# Patient Record
Sex: Female | Born: 1965 | Race: Black or African American | Hispanic: No | Marital: Single | Smoking: Never smoker
Health system: Southern US, Community
[De-identification: ages and names within clinical notes are randomized; demographics above are authoritative.]

## PROBLEM LIST (undated history)

## (undated) DIAGNOSIS — I10 Essential (primary) hypertension: Secondary | ICD-10-CM

## (undated) DIAGNOSIS — R519 Headache, unspecified: Secondary | ICD-10-CM

## (undated) DIAGNOSIS — D649 Anemia, unspecified: Secondary | ICD-10-CM

## (undated) DIAGNOSIS — M1990 Unspecified osteoarthritis, unspecified site: Secondary | ICD-10-CM

## (undated) DIAGNOSIS — R51 Headache: Secondary | ICD-10-CM

## (undated) DIAGNOSIS — Z9889 Other specified postprocedural states: Secondary | ICD-10-CM

## (undated) HISTORY — DX: Headache: R51

## (undated) HISTORY — DX: Other specified postprocedural states: Z98.890

## (undated) HISTORY — PX: KNEE SURGERY: SHX244

## (undated) HISTORY — PX: FINGER SURGERY: SHX640

## (undated) HISTORY — PX: BREAST BIOPSY: SHX20

## (undated) HISTORY — PX: BREAST SURGERY: SHX581

## (undated) HISTORY — DX: Essential (primary) hypertension: I10

## (undated) HISTORY — DX: Headache, unspecified: R51.9

---

## 2005-01-04 ENCOUNTER — Ambulatory Visit: Payer: Self-pay | Admitting: Specialist

## 2005-05-04 ENCOUNTER — Ambulatory Visit: Payer: Self-pay | Admitting: Unknown Physician Specialty

## 2006-05-02 ENCOUNTER — Emergency Department: Payer: Self-pay | Admitting: General Practice

## 2010-06-28 ENCOUNTER — Ambulatory Visit: Payer: Self-pay | Admitting: Obstetrics and Gynecology

## 2014-04-12 HISTORY — PX: INTRAUTERINE DEVICE (IUD) INSERTION: SHX5877

## 2015-06-16 ENCOUNTER — Ambulatory Visit (INDEPENDENT_AMBULATORY_CARE_PROVIDER_SITE_OTHER): Payer: 59 | Admitting: Family

## 2015-06-16 ENCOUNTER — Encounter: Payer: Self-pay | Admitting: Family

## 2015-06-16 VITALS — BP 120/88 | HR 81 | Temp 98.6°F | Resp 18 | Ht 63.0 in | Wt 183.8 lb

## 2015-06-16 DIAGNOSIS — I10 Essential (primary) hypertension: Secondary | ICD-10-CM

## 2015-06-16 NOTE — Progress Notes (Signed)
Pre visit review using our clinic review tool, if applicable. No additional management support is needed unless otherwise documented below in the visit note. 

## 2015-06-16 NOTE — Assessment & Plan Note (Signed)
Recently diagnosed with hypertension and blood pressure remains just below goal of 140/90 with current regimen. Continue current dosage of lisinopril-hydrochlorothiazide. Monitor blood pressure at home. Follow-up with ophthalmology for eye exam. Follow up with office visit in 1 month.

## 2015-06-16 NOTE — Progress Notes (Signed)
Subjective:    Patient ID: Amanda Price, female    DOB: 22-Apr-1966, 49 y.o.   MRN: [id number]  Chief Complaint  Patient presents with  . Establish Care    has been having problems with her knees, says its been going on for years, when walking alot is when they bother her most    HPI:  Amanda Price is a 49 y.o. female with a PMH of hypertension and breast biopsy who presents today for an office visit to establish care.   1.) Blood pressure - Previously diagnosed with hypertension and was not maintained on any blood pressure medication. She was seen by her GYN and noted to have a blood pressure of 220 systolic and does not recall a diastolic medication. She was started on lisinopril-hydrochlorothiazide and takes the medication daily and denies cough or adverse side effect. She takes her blood pressures at the local pharmacy and had a reading of 118/86. Was last seen for an eye exam about year.    BP Readings from Last 3 Encounters:  06/16/15 120/88    No Known Allergies   No outpatient prescriptions prior to visit.   No facility-administered medications prior to visit.     Past Medical History  Diagnosis Date  . Hypertension   . H/O breast biopsy      Past Surgical History  Procedure Laterality Date  . Breast surgery      Breast biopsy  . Knee surgery Right      Family History  Problem Relation Age of Onset  . Healthy Mother   . Healthy Father   . Hypertension Sister   . Hypertension Sister   . Hypertension Sister      History   Social History  . Marital Status: Single    Spouse Name: N/A  . Number of Children: 2  . Years of Education: 14   Occupational History  . ATM Technician   . Freight forwarder    Social History Main Topics  . Smoking status: Never Smoker   . Smokeless tobacco: Never Used  . Alcohol Use: 0.0 oz/week    0 Standard drinks or equivalent per week     Comment: Occasionally  . Drug Use: No  . Sexual Activity: Not on file    Other Topics Concern  . Not on file   Social History Narrative   Fun: Read   Denies religious beliefs effecting health care.   Denies abuse and feels safe at home.      Review of Systems  Eyes:       Negative for changes in vision.   Respiratory: Negative for chest tightness.   Cardiovascular: Negative for chest pain, palpitations and leg swelling.      Objective:    BP 120/88 mmHg  Pulse 81  Temp(Src) 98.6 F (37 C) (Oral)  Resp 18  Ht 5' 3" (1.6 m)  Wt 183 lb 12.8 oz (83.371 kg)  BMI 32.57 kg/m2  SpO2 98% Nursing note and vital signs reviewed.  Physical Exam  Constitutional: She is oriented to person, place, and time. She appears well-developed and well-nourished. No distress.  Cardiovascular: Normal rate, regular rhythm, normal heart sounds and intact distal pulses.   Pulmonary/Chest: Effort normal and breath sounds normal.  Neurological: She is alert and oriented to person, place, and time.  Skin: Skin is warm and dry.  Psychiatric: She has a normal mood and affect. Her behavior is normal. Judgment and thought content normal.  Assessment & Plan:   Problem List Items Addressed This Visit      Cardiovascular and Mediastinum   Essential hypertension - Primary    Recently diagnosed with hypertension and blood pressure remains just below goal of 140/90 with current regimen. Continue current dosage of lisinopril-hydrochlorothiazide. Monitor blood pressure at home. Follow-up with ophthalmology for eye exam. Follow up with office visit in 1 month.      Relevant Medications   lisinopril-hydrochlorothiazide (PRINZIDE,ZESTORETIC) 10-12.5 MG per tablet

## 2015-06-16 NOTE — Patient Instructions (Signed)
Thank you for choosing [facility].  Summary/Instructions:  Your prescription(s) have been submitted to your pharmacy or been printed and provided for you. Please take as directed and contact our office if you believe you are having problem(s) with the medication(s) or have any questions.  If your symptoms worsen or fail to improve, please contact our office for further instruction, or in case of emergency go directly to the emergency room at the closest medical facility.   Hypertension Hypertension, commonly called high blood pressure, is when the force of blood pumping through your arteries is too strong. Your arteries are the blood vessels that carry blood from your heart throughout your body. A blood pressure reading consists of a higher number over a lower number, such as 110/72. The higher number (systolic) is the pressure inside your arteries when your heart pumps. The lower number (diastolic) is the pressure inside your arteries when your heart relaxes. Ideally you want your blood pressure below 120/80. Hypertension forces your heart to work harder to pump blood. Your arteries may become narrow or stiff. Having hypertension puts you at risk for heart disease, stroke, and other problems.  RISK FACTORS Some risk factors for high blood pressure are controllable. Others are not.  Risk factors you cannot control include:   Race. You may be at higher risk if you are African American.  Age. Risk increases with age.  Gender. Men are at higher risk than women before age 45 years. After age 65, women are at higher risk than men. Risk factors you can control include:  Not getting enough exercise or physical activity.  Being overweight.  Getting too much fat, sugar, calories, or salt in your diet.  Drinking too much alcohol. SIGNS AND SYMPTOMS Hypertension does not usually cause signs or symptoms. Extremely high blood pressure (hypertensive crisis) may cause headache, anxiety,  shortness of breath, and nosebleed. DIAGNOSIS  To check if you have hypertension, your health care provider will measure your blood pressure while you are seated, with your arm held at the level of your heart. It should be measured at least twice using the same arm. Certain conditions can cause a difference in blood pressure between your right and left arms. A blood pressure reading that is higher than normal on one occasion does not mean that you need treatment. If one blood pressure reading is high, ask your health care provider about having it checked again. TREATMENT  Treating high blood pressure includes making lifestyle changes and possibly taking medicine. Living a healthy lifestyle can help lower high blood pressure. You may need to change some of your habits. Lifestyle changes may include:  Following the DASH diet. This diet is high in fruits, vegetables, and whole grains. It is low in salt, red meat, and added sugars.  Getting at least 2 hours of brisk physical activity every week.  Losing weight if necessary.  Not smoking.  Limiting alcoholic beverages.  Learning ways to reduce stress. If lifestyle changes are not enough to get your blood pressure under control, your health care provider may prescribe medicine. You may need to take more than one. Work closely with your health care provider to understand the risks and benefits. HOME CARE INSTRUCTIONS  Have your blood pressure rechecked as directed by your health care provider.   Take medicines only as directed by your health care provider. Follow the directions carefully. Blood pressure medicines must be taken as prescribed. The medicine does not work as well when you skip doses.   Skipping doses also puts you at risk for problems.   Do not smoke.   Monitor your blood pressure at home as directed by your health care provider. SEEK MEDICAL CARE IF:   You think you are having a reaction to medicines taken.  You have  recurrent headaches or feel dizzy.  You have swelling in your ankles.  You have trouble with your vision. SEEK IMMEDIATE MEDICAL CARE IF:  You develop a severe headache or confusion.  You have unusual weakness, numbness, or feel faint.  You have severe chest or abdominal pain.  You vomit repeatedly.  You have trouble breathing. MAKE SURE YOU:   Understand these instructions.  Will watch your condition.  Will get help right away if you are not doing well or get worse. Document Released: 10/29/2005 Document Revised: 03/15/2014 Document Reviewed: 08/21/2013 ExitCare Patient Information 2015 [employer]. This information is not intended to replace advice given to you by your health care provider. Make sure you discuss any questions you have with your health care provider.   

## 2015-07-14 ENCOUNTER — Telehealth: Payer: Self-pay | Admitting: Family

## 2015-07-14 MED ORDER — LISINOPRIL-HYDROCHLOROTHIAZIDE 10-12.5 MG PO TABS: 1.0000 | ORAL_TABLET | Freq: Every day | ORAL | Status: DC

## 2015-07-14 NOTE — Telephone Encounter (Signed)
Patient requesting refill for lisinopril-hydrochlorothiazide (PRINZIDE,ZESTORETIC) 10-12.5 MG per tablet [[id number]]  Pharmacy is [facility] in [place], [place]

## 2015-07-14 NOTE — Telephone Encounter (Signed)
Rx sent. Pt aware.  

## 2015-08-16 LAB — HM COLONOSCOPY

## 2015-08-16 LAB — HM PAP SMEAR: HM Pap smear: NORMAL

## 2015-09-26 ENCOUNTER — Ambulatory Visit: Payer: 59 | Admitting: Anesthesiology

## 2015-09-26 ENCOUNTER — Encounter
Admission: RE | Disposition: A | Discharge status: Home / Self Care | Payer: Self-pay | Source: Ambulatory Visit | Attending: Gastroenterology

## 2015-09-26 ENCOUNTER — Encounter: Payer: Self-pay | Admitting: *Deleted

## 2015-09-26 ENCOUNTER — Ambulatory Visit
Admission: RE | Admit: 2015-09-26 | Discharge: 2015-09-26 | Disposition: A | Discharge status: Home / Self Care | Payer: 59 | Source: Ambulatory Visit | Attending: Gastroenterology | Admitting: Gastroenterology

## 2015-09-26 DIAGNOSIS — I10 Essential (primary) hypertension: Secondary | ICD-10-CM | POA: Insufficient documentation

## 2015-09-26 DIAGNOSIS — Z1211 Encounter for screening for malignant neoplasm of colon: Secondary | ICD-10-CM | POA: Insufficient documentation

## 2015-09-26 DIAGNOSIS — Z79899 Other long term (current) drug therapy: Secondary | ICD-10-CM | POA: Diagnosis not present

## 2015-09-26 HISTORY — PX: COLONOSCOPY: SHX5424

## 2015-09-26 LAB — POCT PREGNANCY, URINE: Preg Test, Ur: NEGATIVE

## 2015-09-26 SURGERY — COLONOSCOPY
Anesthesia: General

## 2015-09-26 MED ORDER — PROPOFOL 10 MG/ML IV BOLUS
INTRAVENOUS | Status: DC | PRN
  Administered 2015-09-26 (×2): 50 mg via INTRAVENOUS

## 2015-09-26 MED ORDER — LIDOCAINE HCL (PF) 1 % IJ SOLN
2.0000 mL | Freq: Once | INTRAMUSCULAR | Status: AC
  Administered 2015-09-26: 2 mL via INTRADERMAL
  Filled 2015-09-26: qty 2

## 2015-09-26 MED ORDER — MIDAZOLAM HCL 5 MG/5ML IJ SOLN
INTRAMUSCULAR | Status: DC | PRN
  Administered 2015-09-26: 1 mg via INTRAVENOUS

## 2015-09-26 MED ORDER — PHENYLEPHRINE HCL 10 MG/ML IJ SOLN
INTRAMUSCULAR | Status: DC | PRN
  Administered 2015-09-26: .1 ug via INTRAVENOUS

## 2015-09-26 MED ORDER — PROPOFOL 500 MG/50ML IV EMUL
INTRAVENOUS | Status: DC | PRN
  Administered 2015-09-26: 140 ug/kg/min via INTRAVENOUS

## 2015-09-26 MED ORDER — FENTANYL CITRATE (PF) 100 MCG/2ML IJ SOLN
INTRAMUSCULAR | Status: DC | PRN
  Administered 2015-09-26: 50 ug via INTRAVENOUS

## 2015-09-26 MED ORDER — SODIUM CHLORIDE 0.9 % IV SOLN
INTRAVENOUS | Status: DC
Start: 2015-09-26 — End: 2015-09-26
  Administered 2015-09-26: 1000 mL via INTRAVENOUS
  Administered 2015-09-26: 12:00:00 via INTRAVENOUS

## 2015-09-26 MED ORDER — LIDOCAINE HCL (CARDIAC) 20 MG/ML IV SOLN
INTRAVENOUS | Status: DC | PRN
  Administered 2015-09-26: 30 mg via INTRAVENOUS

## 2015-09-26 NOTE — Anesthesia Postprocedure Evaluation (Signed)
  Anesthesia Post-op Note  Patient: Amanda Price  Procedure(s) Performed: Procedure(s): COLONOSCOPY (N/A)  Anesthesia type:General  Patient location: PACU  Post pain: Pain level controlled  Post assessment: Post-op Vital signs reviewed, Patient's Cardiovascular Status Stable, Respiratory Function Stable, Patent Airway and No signs of Nausea or vomiting  Post vital signs: Reviewed and stable  Last Vitals:  Filed Vitals:   09/26/15 1310  BP: 107/72  Pulse: 85  Temp:   Resp: 15    Level of consciousness: awake, alert  and patient cooperative  Complications: No apparent anesthesia complications

## 2015-09-26 NOTE — Anesthesia Preprocedure Evaluation (Signed)
Anesthesia Evaluation  Patient identified by MRN, date of birth, ID band Patient awake    Reviewed: Allergy & Precautions, H&P , NPO status , Patient's Chart, lab work & pertinent test results, reviewed documented beta blocker date and time   Airway Mallampati: II  TM Distance: >3 FB Neck ROM: full    Dental no notable dental hx.    Pulmonary neg pulmonary ROS,    Pulmonary exam normal breath sounds clear to auscultation       Cardiovascular Exercise Tolerance: Good hypertension, negative cardio ROS   Rhythm:regular Rate:Normal     Neuro/Psych negative neurological ROS  negative psych ROS   GI/Hepatic negative GI ROS, Neg liver ROS,   Endo/Other  negative endocrine ROS  Renal/GU negative Renal ROS  negative genitourinary   Musculoskeletal   Abdominal   Peds  Hematology negative hematology ROS (+)   Anesthesia Other Findings   Reproductive/Obstetrics negative OB ROS                             Anesthesia Physical Anesthesia Plan  ASA: II  Anesthesia Plan: General   Post-op Pain Management:    Induction:   Airway Management Planned:   Additional Equipment:   Intra-op Plan:   Post-operative Plan:   Informed Consent: I have reviewed the patients History and Physical, chart, labs and discussed the procedure including the risks, benefits and alternatives for the proposed anesthesia with the patient or authorized representative who has indicated his/her understanding and acceptance.   Dental Advisory Given  Plan Discussed with: CRNA  Anesthesia Plan Comments:         Anesthesia Quick Evaluation  

## 2015-09-26 NOTE — H&P (Signed)
Outpatient short stay form Pre-procedure 09/26/2015 11:54 AM Christena DeemMartin U Lovena Kluck MD  [facility]  Reason for visit:  Screening colonoscopy  History of present illness:  Patient is a 49 year old female presenting today for screening colonoscopy. Tolerated her prep well. She takes no aspirin or blood thinning products. Is is her first colonoscopy.    Current facility-administered medications:  .  0.9 %  sodium chloride infusion, , Intravenous, Continuous, Christena DeemMartin U Charles Niese, MD, Last Rate: 20 mL/hr at 09/26/15 1124  Prescriptions prior to admission  Medication Sig Dispense Refill Last Dose  . levonorgestrel (MIRENA) 20 MCG/24HR IUD 1 each by Intrauterine route once.     Marland Kitchen. lisinopril-hydrochlorothiazide (PRINZIDE,ZESTORETIC) 10-12.5 MG per tablet Take 1 tablet by mouth daily. 30 tablet 2 09/26/2015 at 0600     No Known Allergies   Past Medical History  Diagnosis Date  . Hypertension   . H/O breast biopsy     Review of systems:      Physical Exam    Heart and lungs: Regular rate and rhythm without rub or gallop, lungs are bilaterally clear.    HEENT: Normocephalic atraumatic eyes are anicteric    Other:     Pertinant exam for procedure: Soft nontender nondistended bowel sounds positive and normoactive.    Planned proceedures: Colonoscopy and indicated procedures. I have discussed the risks benefits and complications of procedures to include not limited to bleeding, infection, perforation and the risk of sedation and the patient wishes to proceed.    Christena DeemMartin U Terease Marcotte, MD Gastroenterology 09/26/2015  11:54 AM

## 2015-09-26 NOTE — Op Note (Signed)
[facility] Patient Name: Amanda Price Procedure Date: 09/26/2015 12:02 PM MRN: [id number] Account #: [id number] Date of Birth: [birth year] Admit Type: Outpatient Age: 49 Room: [facility] ENDO ROOM 3 Gender: Female Note Status: Finalized Procedure:         Colonoscopy Indications:       Screening for colorectal malignant neoplasm Providers:         Christena Deem, MD Referring MD:      Tama Headings. Calone (Referring MD) Medicines:         Monitored Anesthesia Care Complications:     No immediate complications. Procedure:         Pre-Anesthesia Assessment:                    - ASA Grade Assessment: II - A patient with mild systemic                     disease.                    After obtaining informed consent, the colonoscope was                     passed under direct vision. Throughout the procedure, the                     patient's blood pressure, pulse, and oxygen saturations                     were monitored continuously. The Colonoscope was                     introduced through the anus and advanced to the the cecum,                     identified by appendiceal orifice and ileocecal valve. The                     colonoscopy was unusually difficult due to a tortuous                     colon. Successful completion of the procedure was aided by                     changing the patient to a supine position and using manual                     pressure. The patient tolerated the procedure well. The                     quality of the bowel preparation was good. Findings:      The entire examined colon appeared normal.      The digital rectal exam was normal.      The sigmoid colon and descending colon were moderately redundant. Impression:        - The entire examined colon is normal.                    - No specimens collected. Recommendation:    - Discharge patient to home.                    - Repeat colonoscopy in 10 years for screening  purposes. Procedure Code(s): --- Professional ---  [id number], Colonoscopy, flexible; diagnostic, including                     collection of specimen(s) by brushing or washing, when                     performed (separate procedure) Diagnosis Code(s): --- Professional ---                    Z12.11, Encounter for screening for malignant neoplasm of                     colon CPT copyright 2014 American Medical Association. All rights reserved. The codes documented in this report are preliminary and upon coder review may  be revised to meet current compliance requirements. Christena DeemMartin U Skulskie, MD 09/26/2015 12:38:31 PM This report has been signed electronically. Number of Addenda: 0 Note Initiated On: 09/26/2015 12:02 PM Scope Withdrawal Time: 0 hours 6 minutes 34 seconds  Total Procedure Duration: 0 hours 23 minutes 25 seconds       [facility]

## 2015-09-26 NOTE — Transfer of Care (Signed)
Immediate Anesthesia Transfer of Care Note  Patient: Amanda Price  Procedure(s) Performed: Procedure(s): COLONOSCOPY (N/A)  Patient Location: PACU and Short Stay  Anesthesia Type:General  Level of Consciousness: awake, sedated and patient cooperative  Airway & Oxygen Therapy: Patient Spontanous Breathing and Patient connected to nasal cannula oxygen  Post-op Assessment: Report given to RN and Post -op Vital signs reviewed and stable  Post vital signs: Reviewed and stable  Last Vitals:  Filed Vitals:   09/26/15 1239  BP: 90/59  Pulse: 90  Temp: 35.9 C  Resp:     Complications: No apparent anesthesia complications

## 2015-09-28 ENCOUNTER — Encounter: Payer: Self-pay | Admitting: Gastroenterology

## 2015-12-01 ENCOUNTER — Other Ambulatory Visit: Payer: Self-pay | Admitting: Orthopedic Surgery

## 2015-12-01 DIAGNOSIS — M1711 Unilateral primary osteoarthritis, right knee: Secondary | ICD-10-CM

## 2015-12-08 ENCOUNTER — Ambulatory Visit: Payer: 59

## 2015-12-16 ENCOUNTER — Ambulatory Visit: Payer: 59

## 2015-12-30 ENCOUNTER — Ambulatory Visit: Payer: 59 | Attending: Orthopedic Surgery

## 2016-02-03 ENCOUNTER — Other Ambulatory Visit: Payer: Self-pay

## 2016-02-03 MED ORDER — LISINOPRIL-HYDROCHLOROTHIAZIDE 10-12.5 MG PO TABS: 1.0000 | ORAL_TABLET | Freq: Every day | ORAL | Status: DC

## 2016-07-02 ENCOUNTER — Other Ambulatory Visit: Payer: Self-pay | Admitting: Family

## 2016-08-15 ENCOUNTER — Other Ambulatory Visit: Payer: Self-pay | Admitting: Obstetrics and Gynecology

## 2016-08-15 ENCOUNTER — Other Ambulatory Visit: Payer: Self-pay | Admitting: Advanced Practice Midwife

## 2016-08-15 DIAGNOSIS — Z1231 Encounter for screening mammogram for malignant neoplasm of breast: Secondary | ICD-10-CM

## 2016-09-11 ENCOUNTER — Ambulatory Visit
Admission: RE | Admit: 2016-09-11 | Discharge: 2016-09-11 | Disposition: A | Discharge status: Home / Self Care | Payer: 59 | Source: Ambulatory Visit | Attending: Advanced Practice Midwife | Admitting: Advanced Practice Midwife

## 2016-09-11 ENCOUNTER — Encounter: Payer: Self-pay | Admitting: Radiology

## 2016-09-11 DIAGNOSIS — Z1231 Encounter for screening mammogram for malignant neoplasm of breast: Secondary | ICD-10-CM | POA: Diagnosis not present

## 2016-09-14 ENCOUNTER — Other Ambulatory Visit: Payer: Self-pay | Admitting: Advanced Practice Midwife

## 2016-09-14 DIAGNOSIS — R928 Other abnormal and inconclusive findings on diagnostic imaging of breast: Secondary | ICD-10-CM

## 2016-09-17 ENCOUNTER — Ambulatory Visit (INDEPENDENT_AMBULATORY_CARE_PROVIDER_SITE_OTHER): Payer: 59 | Admitting: Family

## 2016-09-17 ENCOUNTER — Encounter: Payer: Self-pay | Admitting: Family

## 2016-09-17 VITALS — BP 140/90 | HR 98 | Temp 98.0°F | Ht 63.0 in | Wt 189.6 lb

## 2016-09-17 DIAGNOSIS — I10 Essential (primary) hypertension: Secondary | ICD-10-CM

## 2016-09-17 DIAGNOSIS — Z7689 Persons encountering health services in other specified circumstances: Secondary | ICD-10-CM | POA: Diagnosis not present

## 2016-09-17 MED ORDER — LISINOPRIL-HYDROCHLOROTHIAZIDE 10-12.5 MG PO TABS: 1.0000 | ORAL_TABLET | Freq: Every day | ORAL | 0 refills | Status: DC

## 2016-09-17 NOTE — Patient Instructions (Signed)
Follow up in one week.  Need labs from [facility].   If there is no improvement in your symptoms, or if there is any worsening of symptoms, or if you have any additional concerns, please return for re-evaluation; or, if we are closed, consider going to the Emergency Room for evaluation if symptoms urgent.  Managing Your High Blood Pressure Blood pressure is a measurement of how forceful your blood is pressing against the walls of the arteries. Arteries are muscular tubes within the circulatory system. Blood pressure does not stay the same. Blood pressure rises when you are active, excited, or nervous; and it lowers during sleep and relaxation. If the numbers measuring your blood pressure stay above normal most of the time, you are at risk for health problems. High blood pressure (hypertension) is a long-term (chronic) condition in which blood pressure is elevated. A blood pressure reading is recorded as two numbers, such as 120 over 80 (or 120/80). The first, higher number is called the systolic pressure. It is a measure of the pressure in your arteries as the heart beats. The second, lower number is called the diastolic pressure. It is a measure of the pressure in your arteries as the heart relaxes between beats.  Keeping your blood pressure in a normal range is important to your overall health and prevention of health problems, such as heart disease and stroke. When your blood pressure is uncontrolled, your heart has to work harder than normal. High blood pressure is a very common condition in adults because blood pressure tends to rise with age. Men and women are equally likely to have hypertension but at different times in life. Before age 51, men are more likely to have hypertension. After 51 years of age, women are more likely to have it. Hypertension is especially common in African Americans. This condition often has no signs or symptoms. The cause of the condition is usually not known. Your caregiver  can help you come up with a plan to keep your blood pressure in a normal, healthy range. BLOOD PRESSURE STAGES Blood pressure is classified into four stages: normal, prehypertension, stage 1, and stage 2. Your blood pressure reading will be used to determine what type of treatment, if any, is necessary. Appropriate treatment options are tied to these four stages:  Normal  Systolic pressure (mm Hg): below 120.  Diastolic pressure (mm Hg): below 80. Prehypertension  Systolic pressure (mm Hg): 120 to 139.  Diastolic pressure (mm Hg): 80 to 89. Stage1  Systolic pressure (mm Hg): 140 to 159.  Diastolic pressure (mm Hg): 90 to 99. Stage2  Systolic pressure (mm Hg): 160 or above.  Diastolic pressure (mm Hg): 100 or above. RISKS RELATED TO HIGH BLOOD PRESSURE Managing your blood pressure is an important responsibility. Uncontrolled high blood pressure can lead to:  A heart attack.  A stroke.  A weakened blood vessel (aneurysm).  Heart failure.  Kidney damage.  Eye damage.  Metabolic syndrome.  Memory and concentration problems. HOW TO MANAGE YOUR BLOOD PRESSURE Blood pressure can be managed effectively with lifestyle changes and medicines (if needed). Your caregiver will help you come up with a plan to bring your blood pressure within a normal range. Your plan should include the following: Education  Read all information provided by your caregivers about how to control blood pressure.  Educate yourself on the latest guidelines and treatment recommendations. New research is always being done to further define the risks and treatments for high blood pressure. Lifestylechanges  Control your weight.  Avoid smoking.  Stay physically active.  Reduce the amount of salt in your diet.  Reduce stress.  Control any chronic conditions, such as high cholesterol or diabetes.  Reduce your alcohol intake. Medicines  Several medicines (antihypertensive medicines) are  available, if needed, to bring blood pressure within a normal range. Communication  Review all the medicines you take with your caregiver because there may be side effects or interactions.  Talk with your caregiver about your diet, exercise habits, and other lifestyle factors that may be contributing to high blood pressure.  See your caregiver regularly. Your caregiver can help you create and adjust your plan for managing high blood pressure. RECOMMENDATIONS FOR TREATMENT AND FOLLOW-UP  The following recommendations are based on current guidelines for managing high blood pressure in nonpregnant adults. Use these recommendations to identify the proper follow-up period or treatment option based on your blood pressure reading. You can discuss these options with your caregiver.  Systolic pressure of 120 to 139 or diastolic pressure of 80 to 89: Follow up with your caregiver as directed.  Systolic pressure of 140 to 160 or diastolic pressure of 90 to 100: Follow up with your caregiver within 2 months.  Systolic pressure above 160 or diastolic pressure above 100: Follow up with your caregiver within 1 month.  Systolic pressure above 180 or diastolic pressure above 110: Consider antihypertensive therapy; follow up with your caregiver within 1 week.  Systolic pressure above 200 or diastolic pressure above 120: Begin antihypertensive therapy; follow up with your caregiver within 1 week.   This information is not intended to replace advice given to you by your health care provider. Make sure you discuss any questions you have with your health care provider.   Document Released: 07/23/2012 Document Reviewed: 07/23/2012 Elsevier Interactive Patient Education [employer]2016 [employer] Inc.

## 2016-09-17 NOTE — Assessment & Plan Note (Addendum)
Uncontrolled. Reassured by normal neurologic exam.No signs or symptoms of hypertensive urgency or emergency at this time. Education provided regarding controlling blood pressure. Gave patient 0.1 mg clonidine and BP came down. Patient not dizzy. Advised that she may need to skip dose of Prinzide today after clonidine. Patient verbalized understanding. Advised to get BP cuff. F/u one week.

## 2016-09-17 NOTE — Progress Notes (Signed)
Pre visit review using our clinic review tool, if applicable. No additional management support is needed unless otherwise documented below in the visit note. 

## 2016-09-17 NOTE — Progress Notes (Signed)
Subjective:    Patient ID: Amanda Price, female    DOB: 01/31/1966, 50 y.o.   MRN: [id number]  CC: Amanda SilvasChante Price is a 50 y.o. female who presents today to establish care.    HPI: Patient is here to establish care as new patient. She was seen by Marcos EkeGreg Calone in 2016 periodically when BP 'high.'. Prior care had been with [facility], requesting records.  She plans to to continue keep women care with [facility]. Had abnormal breast mammogram last week and scheduled repeat.   HTN- has run out of medication. Mild HA today. Denies exertional chest pain or pressure, numbness or tingling radiating to left arm or jaw, palpitations, dizziness,changes in vision, or shortness of breath. Had eye exam in past year. No BP cuff at home. Takes BP at pharmacy.            HISTORY:  Past Medical History:  Diagnosis Date  . Frequent headaches   . H/O breast biopsy   . Hypertension    Past Surgical History:  Procedure Laterality Date  . BREAST BIOPSY    . BREAST SURGERY     Breast biopsy  . COLONOSCOPY N/A 09/26/2015   Procedure: COLONOSCOPY;  Surgeon: Christena DeemMartin U Skulskie, MD;  [facility];  Service: Endoscopy;  Laterality: N/A;  . KNEE SURGERY Right    Family History  Problem Relation Age of Onset  . Healthy Mother   . Healthy Father   . Hypertension Sister   . Diabetes Sister   . Hypertension Sister   . Diabetes Sister   . Hypertension Sister   . Diabetes Sister     Allergies: Patient has no known allergies. Current Outpatient Prescriptions on File Prior to Visit  Medication Sig Dispense Refill  . levonorgestrel (MIRENA) 20 MCG/24HR IUD 1 each by Intrauterine route once.     No current facility-administered medications on file prior to visit.     Social History  Substance Use Topics  . Smoking status: Never Smoker  . Smokeless tobacco: Never Used  . Alcohol use 0.0 oz/week     Comment: Occasionally    Review of Systems  Constitutional: Negative for chills and  fever.  Eyes: Negative for visual disturbance.  Respiratory: Negative for cough.   Cardiovascular: Negative for chest pain and palpitations.  Gastrointestinal: Negative for nausea and vomiting.  Neurological: Positive for headaches (mild). Negative for dizziness.      Objective:    BP 140/90   Pulse 98   Temp 98 F (36.7 C) (Oral)   Ht 5\' 3"  (1.6 m)   Wt 189 lb 9.6 oz (86 kg)   SpO2 99%   BMI 33.59 kg/m  BP Readings from Last 3 Encounters:  09/17/16 140/90  09/26/15 107/72  06/16/15 120/88   Wt Readings from Last 3 Encounters:  09/17/16 189 lb 9.6 oz (86 kg)  09/26/15 183 lb (83 kg)  06/16/15 183 lb 12.8 oz (83.4 kg)    Physical Exam  Constitutional: She appears well-developed and well-nourished.  HENT:  Mouth/Throat: Uvula is midline, oropharynx is clear and moist and mucous membranes are normal.  Eyes: Conjunctivae and EOM are normal. Pupils are equal, round, and reactive to light.  Fundus normal bilaterally.   Cardiovascular: Normal rate, regular rhythm, normal heart sounds and normal pulses.   Pulmonary/Chest: Effort normal and breath sounds normal. She has no wheezes. She has no rhonchi. She has no rales.  Neurological: She is alert. She has normal strength. No cranial  nerve deficit or sensory deficit. She displays a negative Romberg sign.  Reflex Scores:      Bicep reflexes are 2+ on the right side and 2+ on the left side.      Patellar reflexes are 2+ on the right side and 2+ on the left side. Grip equal and strong bilateral upper extremities. Gait strong and steady. Able to perform rapid alternating movement without difficulty.   Skin: Skin is warm and dry.  Psychiatric: She has a normal mood and affect. Her speech is normal and behavior is normal. Thought content normal.  Vitals reviewed.      Assessment & Plan:   Problem List Items Addressed This Visit      Cardiovascular and Mediastinum   Essential hypertension - Primary    Uncontrolled. Reassured by  normal neurologic exam.No signs or symptoms of hypertensive urgency or emergency at this time. Education provided regarding controlling blood pressure. Gave patient 0.1 mg clonidine and BP came down. Patient not dizzy. Advised that she may need to skip dose of Prinzide today after clonidine. Patient verbalized understanding. Advised to get BP cuff. F/u one week.       Relevant Medications   lisinopril-hydrochlorothiazide (PRINZIDE,ZESTORETIC) 10-12.5 MG tablet     Other   Encounter to establish care    Reviewed past medical social and family history with patient. We do not have recent labs and patient would prefer to get them [facility] versus drawing them again today. We are requesting those records. Patient will return for physical.           I am having Ms. Flanagin maintain her levonorgestrel and lisinopril-hydrochlorothiazide.   Meds ordered this encounter  Medications  . lisinopril-hydrochlorothiazide (PRINZIDE,ZESTORETIC) 10-12.5 MG tablet    Sig: Take 1 tablet by mouth daily.    Dispense:  90 tablet    Refill:  0    Needs OV for further refills    Order Specific Question:   Supervising Provider    Answer:   Sherlene ShamsULLO, TERESA L [[id number]]    Return precautions given.   Risks, benefits, and alternatives of the medications and treatment plan prescribed today were discussed, and patient expressed understanding.   Education regarding symptom management and diagnosis given to patient on AVS.  Continue to follow with Rennie PlowmanMargaret Murel Wigle, FNP for routine health maintenance.   Aveah Pascua and I agreed with plan.   Rennie PlowmanMargaret Reggie Bise, FNP

## 2016-09-17 NOTE — Assessment & Plan Note (Signed)
Reviewed past medical social and family history with patient. We do not have recent labs and patient would prefer to get them from [facility] versus drawing them again today. We are requesting those records. Patient will return for physical.

## 2016-09-24 ENCOUNTER — Encounter: Payer: Self-pay | Admitting: Family

## 2016-09-24 ENCOUNTER — Ambulatory Visit (INDEPENDENT_AMBULATORY_CARE_PROVIDER_SITE_OTHER): Payer: 59 | Admitting: Family

## 2016-09-24 VITALS — BP 140/70 | HR 87 | Temp 98.0°F | Ht 63.0 in | Wt 188.6 lb

## 2016-09-24 DIAGNOSIS — I10 Essential (primary) hypertension: Secondary | ICD-10-CM | POA: Diagnosis not present

## 2016-09-24 NOTE — Assessment & Plan Note (Addendum)
At goal. CMP in 2 weeks. F/u 6 months.

## 2016-09-24 NOTE — Patient Instructions (Signed)
You look great.  If there is no improvement in your symptoms, or if there is any worsening of symptoms, or if you have any additional concerns, please return for re-evaluation; or, if we are closed, consider going to the Emergency Room for evaluation if symptoms urgent.  Managing Your High Blood Pressure Blood pressure is a measurement of how forceful your blood is pressing against the walls of the arteries. Arteries are muscular tubes within the circulatory system. Blood pressure does not stay the same. Blood pressure rises when you are active, excited, or nervous; and it lowers during sleep and relaxation. If the numbers measuring your blood pressure stay above normal most of the time, you are at risk for health problems. High blood pressure (hypertension) is a long-term (chronic) condition in which blood pressure is elevated. A blood pressure reading is recorded as two numbers, such as 120 over 80 (or 120/80). The first, higher number is called the systolic pressure. It is a measure of the pressure in your arteries as the heart beats. The second, lower number is called the diastolic pressure. It is a measure of the pressure in your arteries as the heart relaxes between beats.  Keeping your blood pressure in a normal range is important to your overall health and prevention of health problems, such as heart disease and stroke. When your blood pressure is uncontrolled, your heart has to work harder than normal. High blood pressure is a very common condition in adults because blood pressure tends to rise with age. Men and women are equally likely to have hypertension but at different times in life. Before age 51, men are more likely to have hypertension. After 51 years of age, women are more likely to have it. Hypertension is especially common in African Americans. This condition often has no signs or symptoms. The cause of the condition is usually not known. Your caregiver can help you come up with a plan to  keep your blood pressure in a normal, healthy range. BLOOD PRESSURE STAGES Blood pressure is classified into four stages: normal, prehypertension, stage 1, and stage 2. Your blood pressure reading will be used to determine what type of treatment, if any, is necessary. Appropriate treatment options are tied to these four stages:  Normal  Systolic pressure (mm Hg): below 120.  Diastolic pressure (mm Hg): below 80. Prehypertension  Systolic pressure (mm Hg): 120 to 139.  Diastolic pressure (mm Hg): 80 to 89. Stage1  Systolic pressure (mm Hg): 140 to 159.  Diastolic pressure (mm Hg): 90 to 99. Stage2  Systolic pressure (mm Hg): 160 or above.  Diastolic pressure (mm Hg): 100 or above. RISKS RELATED TO HIGH BLOOD PRESSURE Managing your blood pressure is an important responsibility. Uncontrolled high blood pressure can lead to:  A heart attack.  A stroke.  A weakened blood vessel (aneurysm).  Heart failure.  Kidney damage.  Eye damage.  Metabolic syndrome.  Memory and concentration problems. HOW TO MANAGE YOUR BLOOD PRESSURE Blood pressure can be managed effectively with lifestyle changes and medicines (if needed). Your caregiver will help you come up with a plan to bring your blood pressure within a normal range. Your plan should include the following: Education  Read all information provided by your caregivers about how to control blood pressure.  Educate yourself on the latest guidelines and treatment recommendations. New research is always being done to further define the risks and treatments for high blood pressure. Lifestylechanges  Control your weight.  Avoid smoking.  Stay  physically active.  Reduce the amount of salt in your diet.  Reduce stress.  Control any chronic conditions, such as high cholesterol or diabetes.  Reduce your alcohol intake. Medicines  Several medicines (antihypertensive medicines) are available, if needed, to bring blood  pressure within a normal range. Communication  Review all the medicines you take with your caregiver because there may be side effects or interactions.  Talk with your caregiver about your diet, exercise habits, and other lifestyle factors that may be contributing to high blood pressure.  See your caregiver regularly. Your caregiver can help you create and adjust your plan for managing high blood pressure. RECOMMENDATIONS FOR TREATMENT AND FOLLOW-UP  The following recommendations are based on current guidelines for managing high blood pressure in nonpregnant adults. Use these recommendations to identify the proper follow-up period or treatment option based on your blood pressure reading. You can discuss these options with your caregiver.  Systolic pressure of 120 to 139 or diastolic pressure of 80 to 89: Follow up with your caregiver as directed.  Systolic pressure of 140 to 160 or diastolic pressure of 90 to 100: Follow up with your caregiver within 2 months.  Systolic pressure above 160 or diastolic pressure above 100: Follow up with your caregiver within 1 month.  Systolic pressure above 180 or diastolic pressure above 110: Consider antihypertensive therapy; follow up with your caregiver within 1 week.  Systolic pressure above 200 or diastolic pressure above 120: Begin antihypertensive therapy; follow up with your caregiver within 1 week.   This information is not intended to replace advice given to you by your health care provider. Make sure you discuss any questions you have with your health care provider.   Document Released: 07/23/2012 Document Reviewed: 07/23/2012 Elsevier Interactive Patient Education [employer]2016 Elsevier Inc.

## 2016-09-24 NOTE — Progress Notes (Signed)
Pre visit review using our clinic review tool, if applicable. No additional management support is needed unless otherwise documented below in the visit note. 

## 2016-09-24 NOTE — Progress Notes (Signed)
Subjective:    Patient ID: Amanda Price, female    DOB: 09/05/1966, 50 y.o.   MRN: [id number]  CC: Amanda Price is a 50 y.o. female who presents today for follow up.   HPI: Patient here for follow-up for blood pressure. She started her prinzide again one week ago after running out of medication.  Feeling well. No headache. Compliant with medication.  Denies exertional chest pain or pressure, numbness or tingling radiating to left arm or jaw, palpitations, dizziness, frequent headaches, changes in vision, or shortness of breath.   Still haven't received labs from [facility].        HISTORY:  Past Medical History:  Diagnosis Date  . Frequent headaches   . H/O breast biopsy   . Hypertension    Past Surgical History:  Procedure Laterality Date  . BREAST BIOPSY    . BREAST SURGERY     Breast biopsy  . COLONOSCOPY N/A 09/26/2015   Procedure: COLONOSCOPY;  Surgeon: Christena DeemMartin U Skulskie, MD;  [facility];  Service: Endoscopy;  Laterality: N/A;  . KNEE SURGERY Right    Family History  Problem Relation Age of Onset  . Healthy Mother   . Healthy Father   . Hypertension Sister   . Diabetes Sister   . Hypertension Sister   . Diabetes Sister   . Hypertension Sister   . Diabetes Sister     Allergies: Patient has no known allergies. Current Outpatient Prescriptions on File Prior to Visit  Medication Sig Dispense Refill  . levonorgestrel (MIRENA) 20 MCG/24HR IUD 1 each by Intrauterine route once.    Marland Kitchen. lisinopril-hydrochlorothiazide (PRINZIDE,ZESTORETIC) 10-12.5 MG tablet Take 1 tablet by mouth daily. 90 tablet 0   No current facility-administered medications on file prior to visit.     Social History  Substance Use Topics  . Smoking status: Never Smoker  . Smokeless tobacco: Never Used  . Alcohol use 0.0 oz/week     Comment: Occasionally    Review of Systems  Constitutional: Negative for chills and fever.  Eyes: Negative for visual disturbance.  Respiratory:  Negative for cough and shortness of breath.   Cardiovascular: Negative for chest pain and palpitations.  Gastrointestinal: Negative for nausea and vomiting.  Neurological: Negative for dizziness and headaches.      Objective:    BP 140/70   Pulse 87   Temp 98 F (36.7 C) (Oral)   Ht 5\' 3"  (1.6 m)   Wt 188 lb 9.6 oz (85.5 kg)   SpO2 98%   BMI 33.41 kg/m  BP Readings from Last 3 Encounters:  09/24/16 140/70  09/17/16 140/90  09/26/15 107/72   Wt Readings from Last 3 Encounters:  09/24/16 188 lb 9.6 oz (85.5 kg)  09/17/16 189 lb 9.6 oz (86 kg)  09/26/15 183 lb (83 kg)    Physical Exam  Constitutional: She appears well-developed and well-nourished.  Eyes: Conjunctivae are normal.  Cardiovascular: Normal rate, regular rhythm, normal heart sounds and normal pulses.   Pulmonary/Chest: Effort normal and breath sounds normal. She has no wheezes. She has no rhonchi. She has no rales.  Neurological: She is alert.  Skin: Skin is warm and dry.  Psychiatric: She has a normal mood and affect. Her speech is normal and behavior is normal. Thought content normal.  Vitals reviewed.      Assessment & Plan:   Problem List Items Addressed This Visit      Cardiovascular and Mediastinum   Essential hypertension - Primary  At goal. CMP in 2 weeks. F/u 6 months.      Relevant Orders   Comprehensive metabolic panel       I am having Amanda Price maintain her levonorgestrel and lisinopril-hydrochlorothiazide.   No orders of the defined types were placed in this encounter.   Return precautions given.   Risks, benefits, and alternatives of the medications and treatment plan prescribed today were discussed, and patient expressed understanding.   Education regarding symptom management and diagnosis given to patient on AVS.  Continue to follow with Amanda PlowmanMargaret Lailee Hoelzel, FNP for routine health maintenance.   Amanda Price and I agreed with plan.   Amanda PlowmanMargaret Takeshia Wenk, FNP

## 2016-09-25 ENCOUNTER — Ambulatory Visit
Admission: RE | Admit: 2016-09-25 | Discharge: 2016-09-25 | Disposition: A | Discharge status: Home / Self Care | Payer: 59 | Source: Ambulatory Visit | Attending: Advanced Practice Midwife | Admitting: Advanced Practice Midwife

## 2016-09-25 DIAGNOSIS — R928 Other abnormal and inconclusive findings on diagnostic imaging of breast: Secondary | ICD-10-CM

## 2016-09-25 NOTE — Progress Notes (Signed)
Left detailed message on VM about scheduling FU labs.  Also to call back with her BP numbers.

## 2016-09-27 ENCOUNTER — Other Ambulatory Visit: Payer: 59

## 2017-03-11 ENCOUNTER — Telehealth: Payer: Self-pay | Admitting: Obstetrics & Gynecology

## 2017-03-11 DIAGNOSIS — R928 Other abnormal and inconclusive findings on diagnostic imaging of breast: Secondary | ICD-10-CM

## 2017-03-11 NOTE — Telephone Encounter (Signed)
MMG orders put in Epic for LEFT MMG and [place]

## 2017-03-11 NOTE — Telephone Encounter (Signed)
Patient came into the office needing a referral sent over to [facility]. She seen [facility] 6 months ago for her EAN and done her mammogram the same time but they wanted her to come back in 6 months for another scan. They told patient she needed another referral from [place]. Can you please advise?  Thank you.

## 2017-03-11 NOTE — Telephone Encounter (Signed)
Patient is scheduled for 03/29/17 @ 2pm and is aware of the appt. Patient has my ext.

## 2017-03-11 NOTE — Telephone Encounter (Signed)
Can you put in referral for pt?

## 2017-03-15 ENCOUNTER — Other Ambulatory Visit: Payer: Self-pay | Admitting: Family

## 2017-03-15 DIAGNOSIS — I10 Essential (primary) hypertension: Secondary | ICD-10-CM

## 2017-03-22 ENCOUNTER — Other Ambulatory Visit: Payer: Self-pay

## 2017-03-22 DIAGNOSIS — I10 Essential (primary) hypertension: Secondary | ICD-10-CM

## 2017-03-22 MED ORDER — LISINOPRIL-HYDROCHLOROTHIAZIDE 10-12.5 MG PO TABS: 1.0000 | ORAL_TABLET | Freq: Every day | ORAL | 0 refills | Status: DC

## 2017-03-29 ENCOUNTER — Encounter: Payer: Self-pay | Admitting: Obstetrics & Gynecology

## 2017-03-29 ENCOUNTER — Ambulatory Visit
Admission: RE | Admit: 2017-03-29 | Discharge: 2017-03-29 | Disposition: A | Discharge status: Home / Self Care | Payer: 59 | Source: Ambulatory Visit | Attending: Obstetrics & Gynecology | Admitting: Obstetrics & Gynecology

## 2017-03-29 DIAGNOSIS — R928 Other abnormal and inconclusive findings on diagnostic imaging of breast: Secondary | ICD-10-CM

## 2017-03-29 DIAGNOSIS — N6002 Solitary cyst of left breast: Secondary | ICD-10-CM | POA: Insufficient documentation

## 2017-08-12 ENCOUNTER — Telehealth: Payer: Self-pay | Admitting: Advanced Practice Midwife

## 2017-08-12 DIAGNOSIS — Z1239 Encounter for other screening for malignant neoplasm of breast: Secondary | ICD-10-CM

## 2017-08-12 NOTE — Telephone Encounter (Signed)
pt came in needing an order sent to [facility] for another mammogram. She goes every 6 months

## 2017-08-12 NOTE — Telephone Encounter (Signed)
Amanda Price is going to call Amanda Price in the morning and she will f/u with you.

## 2017-08-13 ENCOUNTER — Other Ambulatory Visit: Payer: Self-pay | Admitting: Obstetrics & Gynecology

## 2017-08-13 DIAGNOSIS — N6320 Unspecified lump in the left breast, unspecified quadrant: Secondary | ICD-10-CM

## 2017-08-13 NOTE — Telephone Encounter (Signed)
Order done

## 2017-08-13 NOTE — Telephone Encounter (Signed)
done

## 2017-08-14 NOTE — Telephone Encounter (Signed)
Per Melissa @ [place], patient had 09/11/16 screening mammo, 09/25/16 add'l views, 03/29/17 6 mos f/u.  Patient is scheduled for Monday, 09/30/17 @ 11am at the [place] location. Lmtrc.

## 2017-08-16 ENCOUNTER — Encounter: Payer: Self-pay | Admitting: Advanced Practice Midwife

## 2017-08-16 ENCOUNTER — Ambulatory Visit (INDEPENDENT_AMBULATORY_CARE_PROVIDER_SITE_OTHER): Payer: 59 | Admitting: Advanced Practice Midwife

## 2017-08-16 VITALS — BP 138/88 | Ht 62.0 in | Wt 190.0 lb

## 2017-08-16 DIAGNOSIS — Z01419 Encounter for gynecological examination (general) (routine) without abnormal findings: Secondary | ICD-10-CM

## 2017-08-16 DIAGNOSIS — Z113 Encounter for screening for infections with a predominantly sexual mode of transmission: Secondary | ICD-10-CM

## 2017-08-16 DIAGNOSIS — Z0000 Encounter for general adult medical examination without abnormal findings: Secondary | ICD-10-CM | POA: Diagnosis not present

## 2017-08-16 NOTE — Telephone Encounter (Signed)
Patient is aware of appt.

## 2017-08-16 NOTE — Progress Notes (Signed)
Patient ID: Amanda Price, female   DOB: 09/23/1966, 51 y.o.   MRN: [id number]     Gynecology Annual Exam  PCP: Burnard Hawthorne, FNP  Chief Complaint:  Chief Complaint  Patient presents with  . Annual Exam    History of Present Illness:Patient is a 51 y.o. No obstetric history on file. presents for annual exam. The patient has no complaints today. She requests lab work and screening for STDs. Her sisters have all had cycles into their mid 5s so she thinks she may need another IUD after the current one expires. We discussed checking her hormone levels in the future to determine the need for another IUD. Unable to see Kerby Nora charting currently and patient has IUD card at home but she thinks it has been in either 3 or 4 years.  LMP: No LMP recorded. Patient is not currently having periods (Reason: IUD). Menarche:not applicable Average Interval: no periods Postcoital Bleeding: no Dysmenorrhea: not applicable   The patient is sexually active. She denies dyspareunia.  The patient does perform self breast exams.  There is no notable family history of breast or ovarian cancer in her family. She has f/u mammograms every 6 months currently for observation of a lump in her left breast. Her next mammogram is in November.  The patient wears seatbelts: yes.   The patient has regular exercise: yes.    The patient denies current symptoms of depression.     Review of Systems: Review of Systems  Constitutional: Negative.   HENT: Negative.   Eyes: Negative.   Respiratory: Negative.   Cardiovascular: Negative.   Gastrointestinal: Negative.   Genitourinary: Negative.   Musculoskeletal: Negative.   Skin: Negative.   Neurological: Negative.   Endo/Heme/Allergies: Negative.   Psychiatric/Behavioral: Negative.     Past Medical History:  Past Medical History:  Diagnosis Date  . Frequent headaches   . H/O breast biopsy   . Hypertension     Past Surgical History:  Past Surgical History:   Procedure Laterality Date  . BREAST BIOPSY Left    needle per pt  . BREAST SURGERY     Breast biopsy  . COLONOSCOPY N/A 09/26/2015   Procedure: COLONOSCOPY;  Surgeon: Lollie Sails, MD;  [facility] ENDOS[facility];  Service: Endoscopy;  Laterality: N/A;  . KNEE SURGERY Right     Gynecologic History:  No LMP recorded. Patient is not currently having periods (Reason: IUD). Last Pap: 1 year ago Results were:  no abnormalities  Last mammogram: 6 months ago Results were: BI-RAD III probably benign Obstetric History: No obstetric history on file.  Family History:  Family History  Problem Relation Age of Onset  . Healthy Mother   . Healthy Father   . Hypertension Sister   . Diabetes Sister   . Hypertension Sister   . Diabetes Sister   . Hypertension Sister   . Diabetes Sister   . Breast cancer Neg Hx     Social History:  Social History   Social History  . Marital status: Single    Spouse name: N/A  . Number of children: 2  . Years of education: 14   Occupational History  . ATM Technician   . Freight forwarder    Social History Main Topics  . Smoking status: Never Smoker  . Smokeless tobacco: Never Used  . Alcohol use 0.0 oz/week     Comment: Occasionally  . Drug use: No  . Sexual activity: Yes    Birth control/ protection: IUD  Other Topics Concern  . Not on file   Social History Narrative   Born in [place].    Living in [place].   Freight forwarder in plant.    Has two sons. One with lives her.          Allergies:  No Known Allergies  Medications: Prior to Admission medications   Medication Sig Start Date End Date Taking? Authorizing Provider  levonorgestrel (MIRENA) 20 MCG/24HR IUD 1 each by Intrauterine route once.   Yes [provider]  lisinopril-hydrochlorothiazide (PRINZIDE,ZESTORETIC) 10-12.5 MG tablet Take 1 tablet by mouth daily. 03/22/17  Yes Arnett, Yvetta Coder, FNP  XOLEGEL 2 % GEL APPLY TO THE AFFECTED AREA(S) TWICE DAILY  07/26/17   [provider]    Physical Exam Vitals: Blood pressure 138/88, height _0  (1.575 m), weight 190 lb (86.2 kg).  General: NAD HEENT: normocephalic, anicteric Thyroid: no enlargement, no palpable nodules Pulmonary: No increased work of breathing, CTAB Cardiovascular: RRR, distal pulses 2+ Breast: Breast symmetrical, no tenderness, no palpable nodules or masses, no skin or nipple retraction present, no nipple discharge.  No axillary or supraclavicular lymphadenopathy. Abdomen: NABS, soft, non-tender, non-distended.  Umbilicus without lesions.  No hepatomegaly, splenomegaly or masses palpable. No evidence of hernia  Genitourinary:  External: Normal external female genitalia.  Normal urethral meatus, normal  Bartholin's and Skene's glands.    Vagina: Normal vaginal mucosa, no evidence of prolapse.    Cervix: Grossly normal in appearance, no bleeding, no CMT, IUD strings 2 cm  Uterus: Non-enlarged, mobile, normal contour.    Adnexa: ovaries non-enlarged, no adnexal masses  Rectal: deferred  Lymphatic: no evidence of inguinal lymphadenopathy Extremities: no edema, erythema, or tenderness Neurologic: Grossly intact Psychiatric: mood appropriate, affect full    Assessment: 51 y.o. No obstetric history on file. routine annual exam  Plan: Problem List Items Addressed This Visit    None    Visit Diagnoses    Well woman exam with routine gynecological exam    -  Primary   Relevant Orders   STD Panel   GC/Chlamydia Probe Amp   Screen for sexually transmitted diseases       Relevant Orders   STD Panel   GC/Chlamydia Probe Amp   Blood tests for routine general physical examination       Relevant Orders   VITAMIN D 25 Hydroxy (Vit-D Deficiency, Fractures)   Hgb A1c w/o eAG   Lipid Panel With LDL/HDL Ratio   CBC      1) Mammogram - recommend yearly screening mammogram.  Mammogram Is up to date  2) STI screening was offered and accepted  3) ASCCP guidelines and  rational discussed.  Patient opts for every 3 years screening interval  4) Osteoporosis  - per USPTF routine screening DEXA at age 46  Consider FDA-approved medical therapies in postmenopausal women and men aged 77 years and older, based on the following: a) A hip or vertebral (clinical or morphometric) fracture b) T-score ? -2.5 at the femoral neck or spine after appropriate evaluation to exclude secondary causes C) Low bone mass (T-score between -1.0 and -2.5 at the femoral neck or spine) and a 10-year probability of a hip fracture ? 3% or a 10-year probability of a major osteoporosis-related fracture ? 20% based on the [place]-adapted WHO algorithm   5) Routine healthcare maintenance including cholesterol, diabetes screening discussed Ordered today  6) Colonoscopy Up to date- done 2 years ago.  Screening recommended starting at age 6 for average  risk individuals, age 78 for individuals deemed at increased risk (including African Americans) and recommended to continue until age 41.  For patient age 71-85 individualized approach is recommended.  Gold standard screening is via colonoscopy, Cologuard screening is an acceptable alternative for patient unwilling or unable to undergo colonoscopy.  "Colorectal cancer screening for average?risk adults: 2018 guideline update from the American Cancer Society"[place]: A Cancer Journal for Clinicians: Apr 10, 2017   7) Follow up 1 year for routine annual  Rod Can, [place]

## 2017-08-20 LAB — CBC
HEMATOCRIT: 37 % (ref 34.0–46.6)
HEMOGLOBIN: 11.7 g/dL (ref 11.1–15.9)
MCH: 27.3 pg (ref 26.6–33.0)
MCHC: 31.6 g/dL (ref 31.5–35.7)
MCV: 86 fL (ref 79–97)
Platelets: 239 10*3/uL (ref 150–379)
RBC: 4.28 x10E6/uL (ref 3.77–5.28)
RDW: 15.8 % — ABNORMAL HIGH (ref 12.3–15.4)
WBC: 7.7 10*3/uL (ref 3.4–10.8)

## 2017-08-20 LAB — RPR+HSVIGM+HBSAG+HSV2(IGG)+...
HEP B S AG: NEGATIVE
HIV SCREEN 4TH GENERATION: NONREACTIVE
HSV 2 IGG, TYPE SPEC: 9.07 {index} — AB (ref 0.00–0.90)
RPR: NONREACTIVE

## 2017-08-20 LAB — LIPID PANEL WITH LDL/HDL RATIO
Cholesterol, Total: 164 mg/dL (ref 100–199)
HDL: 49 mg/dL (ref >39)
LDL Calculated: 101 mg/dL — ABNORMAL HIGH (ref 0–99)
LDL/HDL RATIO: 2.1 ratio (ref 0.0–3.2)
TRIGLYCERIDES: 70 mg/dL (ref 0–149)
VLDL Cholesterol Cal: 14 mg/dL (ref 5–40)

## 2017-08-20 LAB — HGB A1C W/O EAG: Hgb A1c MFr Bld: 5.3 % (ref 4.8–5.6)

## 2017-08-20 LAB — VITAMIN D 25 HYDROXY (VIT D DEFICIENCY, FRACTURES): VIT D 25 HYDROXY: 13.8 ng/mL — AB (ref 30.0–100.0)

## 2017-08-20 LAB — GC/CHLAMYDIA PROBE AMP
CHLAMYDIA, DNA PROBE: NEGATIVE
NEISSERIA GONORRHOEAE BY PCR: NEGATIVE

## 2017-08-23 ENCOUNTER — Other Ambulatory Visit: Payer: Self-pay | Admitting: Advanced Practice Midwife

## 2017-08-23 DIAGNOSIS — B009 Herpesviral infection, unspecified: Secondary | ICD-10-CM

## 2017-08-23 MED ORDER — VALACYCLOVIR HCL 500 MG PO TABS: 500.0000 mg | ORAL_TABLET | Freq: Two times a day (BID) | ORAL | 6 refills | Status: DC

## 2017-09-30 ENCOUNTER — Ambulatory Visit
Admission: RE | Admit: 2017-09-30 | Discharge: 2017-09-30 | Disposition: A | Discharge status: Home / Self Care | Payer: 59 | Source: Ambulatory Visit | Attending: Obstetrics & Gynecology | Admitting: Obstetrics & Gynecology

## 2017-09-30 DIAGNOSIS — N6324 Unspecified lump in the left breast, lower inner quadrant: Secondary | ICD-10-CM | POA: Insufficient documentation

## 2017-09-30 DIAGNOSIS — R922 Inconclusive mammogram: Secondary | ICD-10-CM | POA: Diagnosis not present

## 2017-09-30 DIAGNOSIS — R928 Other abnormal and inconclusive findings on diagnostic imaging of breast: Secondary | ICD-10-CM | POA: Diagnosis not present

## 2017-09-30 DIAGNOSIS — N6320 Unspecified lump in the left breast, unspecified quadrant: Secondary | ICD-10-CM

## 2017-09-30 NOTE — Progress Notes (Signed)
Your patient 

## 2018-02-04 ENCOUNTER — Other Ambulatory Visit: Payer: Self-pay | Admitting: Advanced Practice Midwife

## 2018-02-04 DIAGNOSIS — B009 Herpesviral infection, unspecified: Secondary | ICD-10-CM

## 2018-02-04 NOTE — Telephone Encounter (Signed)
Please advise 

## 2018-02-05 ENCOUNTER — Other Ambulatory Visit: Payer: Self-pay | Admitting: Advanced Practice Midwife

## 2018-02-05 NOTE — Progress Notes (Signed)
Rx valtrex request approved

## 2018-03-17 ENCOUNTER — Other Ambulatory Visit: Payer: Self-pay | Admitting: Advanced Practice Midwife

## 2018-03-17 DIAGNOSIS — B009 Herpesviral infection, unspecified: Secondary | ICD-10-CM

## 2018-03-28 DIAGNOSIS — L219 Seborrheic dermatitis, unspecified: Secondary | ICD-10-CM | POA: Diagnosis not present

## 2018-04-18 ENCOUNTER — Other Ambulatory Visit: Payer: Self-pay | Admitting: Advanced Practice Midwife

## 2018-04-18 DIAGNOSIS — B009 Herpesviral infection, unspecified: Secondary | ICD-10-CM

## 2018-05-08 ENCOUNTER — Other Ambulatory Visit: Payer: Self-pay | Admitting: Advanced Practice Midwife

## 2018-05-08 DIAGNOSIS — B009 Herpesviral infection, unspecified: Secondary | ICD-10-CM

## 2018-05-12 NOTE — Telephone Encounter (Signed)
Please advise 

## 2018-06-27 ENCOUNTER — Other Ambulatory Visit: Payer: Self-pay | Admitting: Family

## 2018-06-27 DIAGNOSIS — I10 Essential (primary) hypertension: Secondary | ICD-10-CM

## 2018-07-10 ENCOUNTER — Other Ambulatory Visit: Payer: Self-pay | Admitting: Family

## 2018-07-10 DIAGNOSIS — I10 Essential (primary) hypertension: Secondary | ICD-10-CM

## 2018-09-08 ENCOUNTER — Telehealth: Payer: Self-pay

## 2018-09-08 ENCOUNTER — Other Ambulatory Visit: Payer: Self-pay | Admitting: Obstetrics & Gynecology

## 2018-09-08 DIAGNOSIS — N6320 Unspecified lump in the left breast, unspecified quadrant: Secondary | ICD-10-CM

## 2018-09-08 NOTE — Telephone Encounter (Signed)
Copied from CRM [id number]. Topic: General - Other >> Sep 08, 2018  9:06 AM Amanda Price wrote: Reason for CRM: Patient is requesting to schedule a Blood Pressure check with M.Arnett. Her schedule is blocked. Please advise >> Sep 08, 2018  9:22 AM Marquis Buggy A wrote: Wrong office

## 2018-09-08 NOTE — Telephone Encounter (Signed)
Copied from CRM #[id number]. Topic: General - Other >> Sep 08, 2018  9:06 AM Poole, Shalonda wrote: Reason for CRM: Patient is requesting to schedule a Blood Pressure check with M.Arnett. Her schedule is blocked. Please advise >> Sep 08, 2018  9:22 AM Overman, Brittany A wrote: Wrong office 

## 2018-09-08 NOTE — Telephone Encounter (Signed)
Left voice mail for patient to call back ok for [facility] to speak to patient    

## 2018-09-08 NOTE — Telephone Encounter (Signed)
Amanda Price Would you look at this?  She can see any provider. Lauren?

## 2018-09-10 NOTE — Telephone Encounter (Signed)
Appointment schedule with Leotis Shames , NP for 09/11/18.

## 2018-09-11 ENCOUNTER — Ambulatory Visit (INDEPENDENT_AMBULATORY_CARE_PROVIDER_SITE_OTHER): Payer: 59

## 2018-09-11 ENCOUNTER — Encounter: Payer: Self-pay | Admitting: Family Medicine

## 2018-09-11 ENCOUNTER — Ambulatory Visit: Payer: 59 | Admitting: Family Medicine

## 2018-09-11 VITALS — BP 140/96 | HR 101 | Temp 98.4°F | Ht 63.0 in | Wt 200.0 lb

## 2018-09-11 DIAGNOSIS — I10 Essential (primary) hypertension: Secondary | ICD-10-CM

## 2018-09-11 DIAGNOSIS — M25561 Pain in right knee: Secondary | ICD-10-CM

## 2018-09-11 DIAGNOSIS — G8929 Other chronic pain: Secondary | ICD-10-CM | POA: Diagnosis not present

## 2018-09-11 LAB — CBC WITH DIFFERENTIAL/PLATELET
BASOS ABS: 0 10*3/uL (ref 0.0–0.1)
Basophils Relative: 0.6 % (ref 0.0–3.0)
EOS PCT: 2.7 % (ref 0.0–5.0)
Eosinophils Absolute: 0.2 10*3/uL (ref 0.0–0.7)
HCT: 35.5 % — ABNORMAL LOW (ref 36.0–46.0)
Hemoglobin: 11.5 g/dL — ABNORMAL LOW (ref 12.0–15.0)
LYMPHS ABS: 2.6 10*3/uL (ref 0.7–4.0)
Lymphocytes Relative: 31 % (ref 12.0–46.0)
MCHC: 32.5 g/dL (ref 30.0–36.0)
MCV: 83.7 fl (ref 78.0–100.0)
MONO ABS: 0.7 10*3/uL (ref 0.1–1.0)
MONOS PCT: 8.5 % (ref 3.0–12.0)
NEUTROS ABS: 4.7 10*3/uL (ref 1.4–7.7)
NEUTROS PCT: 57.2 % (ref 43.0–77.0)
PLATELETS: 235 10*3/uL (ref 150.0–400.0)
RBC: 4.24 Mil/uL (ref 3.87–5.11)
RDW: 14.9 % (ref 11.5–15.5)
WBC: 8.3 10*3/uL (ref 4.0–10.5)

## 2018-09-11 LAB — COMPREHENSIVE METABOLIC PANEL
ALK PHOS: 83 U/L (ref 39–117)
ALT: 14 U/L (ref 0–35)
AST: 15 U/L (ref 0–37)
Albumin: 4 g/dL (ref 3.5–5.2)
BILIRUBIN TOTAL: 0.3 mg/dL (ref 0.2–1.2)
BUN: 19 mg/dL (ref 6–23)
CO2: 30 mEq/L (ref 19–32)
CREATININE: 0.81 mg/dL (ref 0.40–1.20)
Calcium: 9.1 mg/dL (ref 8.4–10.5)
Chloride: 103 mEq/L (ref 96–112)
GFR: 94.98 mL/min (ref >60.00)
GLUCOSE: 93 mg/dL (ref 70–99)
Potassium: 3.8 mEq/L (ref 3.5–5.1)
SODIUM: 138 meq/L (ref 135–145)
TOTAL PROTEIN: 6.8 g/dL (ref 6.0–8.3)

## 2018-09-11 LAB — LIPID PANEL
Cholesterol: 157 mg/dL (ref 0–200)
HDL: 37.2 mg/dL — ABNORMAL LOW (ref >39.00)
LDL Cholesterol: 96 mg/dL (ref 0–99)
NONHDL: 120.16
Total CHOL/HDL Ratio: 4
Triglycerides: 119 mg/dL (ref 0.0–149.0)
VLDL: 23.8 mg/dL (ref 0.0–40.0)

## 2018-09-11 MED ORDER — LISINOPRIL-HYDROCHLOROTHIAZIDE 10-12.5 MG PO TABS: 1.0000 | ORAL_TABLET | Freq: Every day | ORAL | 0 refills | Status: DC

## 2018-09-11 NOTE — Patient Instructions (Signed)

## 2018-09-11 NOTE — Progress Notes (Signed)
Subjective:    Patient ID: Amanda Price, female    DOB: Jul 18, 1966, 52 y.o.   MRN: [id number]  HPI   Patient presents to clinic for follow-up of blood pressure.  Patient has not been seen in office for recheck since November 2017.  Patient states she has recently run out of her blood pressure medication and needs a refill.  Patient states she has not had BP meds for the past week, so this is why her blood pressure has been running slightly higher.  Denies any lower extremity swelling.  Denies any chest pain, palpitations, shortness of breath, syncope.  Patient also complains of pain in right knee.  Patient states is an aching type pain. Patient states she has seen orthopedics in the past, and they told her that she would need a knee replacement at some point in the future.  Patient Active Problem List   Diagnosis Date Noted  . Encounter to establish care 09/17/2016  . Essential hypertension 06/16/2015   Social History   Tobacco Use  . Smoking status: Never Smoker  . Smokeless tobacco: Never Used  Substance Use Topics  . Alcohol use: Yes    Alcohol/week: 0.0 standard drinks    Comment: Occasionally   Review of Systems   Constitutional: Negative for chills, fatigue and fever.  HENT: Negative for congestion, ear pain, sinus pain and sore throat.   Eyes: Negative.   Respiratory: Negative for cough, shortness of breath and wheezing.   Cardiovascular: Negative for chest pain, palpitations and leg swelling.  Gastrointestinal: Negative for abdominal pain, diarrhea, nausea and vomiting.  Genitourinary: Negative for dysuria, frequency and urgency.  Musculoskeletal: right knee pain Skin: Negative for color change, pallor and rash.  Neurological: Negative for syncope, light-headedness and headaches.  Psychiatric/Behavioral: The patient is not nervous/anxious.       Objective:   Physical Exam  Constitutional: She appears well-developed and well-nourished. No distress.  Head:  Normocephalic and atraumatic.  Eyes: Pupils are equal, round, and reactive to light. EOM are normal. No scleral icterus.  Neck: Normal range of motion. Neck supple. No tracheal deviation present.  Cardiovascular: Normal rate, regular rhythm and normal heart sounds. No LE edema. No carotid bruit. Pulmonary/Chest: Effort normal and breath sounds normal. No respiratory distress. She has no wheezes. She has no rales.  Abdominal: Soft. Bowel sounds are normal. There is no tenderness.  Neurological: She is alert and oriented to person, place, and time.  Musculoskeletal: No limp, Gait normal. Pain in right knee at extreme limits of range.  Skin: Skin is warm and dry. No pallor.  Psychiatric: She has a normal mood and affect. Her behavior is normal. Thought content normal.  Nursing note and vitals reviewed.     Vitals:   09/11/18 1437  BP: (!) 140/96  Pulse: (!) 101  Temp: 98.4 F (36.9 C)  SpO2: 98%   Assessment & Plan:   Essential hypertension-patient will resume lisinopril hydrochlorothiazide once daily.  We will get new lab work in clinic today including CBC, CMP, lipid panel thyroid panel.  Chronic pain of right knee-we will get new x-ray of right knee.  Patient advised she can use Tylenol or Motrin as needed for knee pain.  Also suggested she wear a soft knee brace for added joint support.  After we get results of x-ray, we discussed possibility of new orthopedic referral.  Patient will follow back up with clinic in 2 to 3 weeks for recheck of blood pressure after restarting her medication.

## 2018-09-12 LAB — THYROID PANEL WITH TSH
FREE THYROXINE INDEX: 2.3 (ref 1.4–3.8)
T3 UPTAKE: 30 % (ref 22–35)
T4 TOTAL: 7.7 ug/dL (ref 5.1–11.9)
TSH: 1.65 mIU/L

## 2018-09-15 ENCOUNTER — Other Ambulatory Visit (HOSPITAL_COMMUNITY): Payer: Self-pay | Admitting: Family

## 2018-09-15 DIAGNOSIS — M25561 Pain in right knee: Principal | ICD-10-CM

## 2018-09-15 DIAGNOSIS — G8929 Other chronic pain: Secondary | ICD-10-CM

## 2018-09-16 ENCOUNTER — Ambulatory Visit (INDEPENDENT_AMBULATORY_CARE_PROVIDER_SITE_OTHER): Payer: 59 | Admitting: Advanced Practice Midwife

## 2018-09-16 ENCOUNTER — Other Ambulatory Visit (HOSPITAL_COMMUNITY)
Admission: RE | Admit: 2018-09-16 | Discharge: 2018-09-16 | Disposition: A | Discharge status: Home / Self Care | Payer: 59 | Source: Ambulatory Visit | Attending: Advanced Practice Midwife | Admitting: Advanced Practice Midwife

## 2018-09-16 ENCOUNTER — Encounter: Payer: Self-pay | Admitting: Advanced Practice Midwife

## 2018-09-16 VITALS — BP 134/90 | HR 126 | Ht 63.0 in | Wt 197.0 lb

## 2018-09-16 DIAGNOSIS — Z01419 Encounter for gynecological examination (general) (routine) without abnormal findings: Secondary | ICD-10-CM | POA: Insufficient documentation

## 2018-09-16 DIAGNOSIS — Z113 Encounter for screening for infections with a predominantly sexual mode of transmission: Secondary | ICD-10-CM | POA: Diagnosis present

## 2018-09-16 DIAGNOSIS — B009 Herpesviral infection, unspecified: Secondary | ICD-10-CM | POA: Insufficient documentation

## 2018-09-16 MED ORDER — VALACYCLOVIR HCL 500 MG PO TABS: 500.0000 mg | ORAL_TABLET | Freq: Every day | ORAL | 4 refills | Status: DC

## 2018-09-16 NOTE — Patient Instructions (Signed)
Mediterranean Diet A Mediterranean diet refers to food and lifestyle choices that are based on the traditions of countries located on the [place]. This way of eating has been shown to help prevent certain conditions and improve outcomes for people who have chronic diseases, like kidney disease and heart disease. What are tips for following this plan? Lifestyle  Cook and eat meals together with your family, when possible.  Drink enough fluid to keep your urine clear or pale yellow.  Be physically active every day. This includes: ? Aerobic exercise like running or swimming. ? Leisure activities like gardening, walking, or housework.  Get 7-8 hours of sleep each night.  If recommended by your health care provider, drink red wine in moderation. This means 1 glass a day for nonpregnant women and 2 glasses a day for men. A glass of wine equals 5 oz (150 mL). Reading food labels  Check the serving size of packaged foods. For foods such as rice and pasta, the serving size refers to the amount of cooked product, not dry.  Check the total fat in packaged foods. Avoid foods that have saturated fat or trans fats.  Check the ingredients list for added sugars, such as corn syrup. Shopping  At the grocery store, buy most of your food from the areas near the walls of the store. This includes: ? Fresh fruits and vegetables (produce). ? Grains, beans, nuts, and seeds. Some of these may be available in unpackaged forms or large amounts (in bulk). ? Fresh seafood. ? Poultry and eggs. ? Low-fat dairy products.  Buy whole ingredients instead of prepackaged foods.  Buy fresh fruits and vegetables in-season from local farmers markets.  Buy frozen fruits and vegetables in resealable bags.  If you do not have access to quality fresh seafood, buy precooked frozen shrimp or canned fish, such as tuna, salmon, or sardines.  Buy small amounts of raw or cooked vegetables, salads, or olives from the  deli or salad bar at your store.  Stock your pantry so you always have certain foods on hand, such as olive oil, canned tuna, canned tomatoes, rice, pasta, and beans. Cooking  Cook foods with extra-virgin olive oil instead of using butter or other vegetable oils.  Have meat as a side dish, and have vegetables or grains as your main dish. This means having meat in small portions or adding small amounts of meat to foods like pasta or stew.  Use beans or vegetables instead of meat in common dishes like chili or lasagna.  Experiment with different cooking methods. Try roasting or broiling vegetables instead of steaming or sauteing them.  Add frozen vegetables to soups, stews, pasta, or rice.  Add nuts or seeds for added healthy fat at each meal. You can add these to yogurt, salads, or vegetable dishes.  Marinate fish or vegetables using olive oil, lemon juice, garlic, and fresh herbs. Meal planning  Plan to eat 1 vegetarian meal one day each week. Try to work up to 2 vegetarian meals, if possible.  Eat seafood 2 or more times a week.  Have healthy snacks readily available, such as: ? Vegetable sticks with hummus. ? Greek yogurt. ? Fruit and nut trail mix.  Eat balanced meals throughout the week. This includes: ? Fruit: 2-3 servings a day ? Vegetables: 4-5 servings a day ? Low-fat dairy: 2 servings a day ? Fish, poultry, or lean meat: 1 serving a day ? Beans and legumes: 2 or more servings a week ? Nuts   and seeds: 1-2 servings a day ? Whole grains: 6-8 servings a day ? Extra-virgin olive oil: 3-4 servings a day  Limit red meat and sweets to only a few servings a month What are my food choices?  Mediterranean diet ? Recommended ? Grains: Whole-grain pasta. Brown rice. Bulgar wheat. Polenta. Couscous. Whole-wheat bread. Oatmeal. Quinoa. ? Vegetables: Artichokes. Beets. Broccoli. Cabbage. Carrots. Eggplant. Green beans. Chard. Kale. Spinach. Onions. Leeks. Peas. Squash.  Tomatoes. Peppers. Radishes. ? Fruits: Apples. Apricots. Avocado. Berries. Bananas. Cherries. Dates. Figs. Grapes. Lemons. Melon. Oranges. Peaches. Plums. Pomegranate. ? Meats and other protein foods: Beans. Almonds. Sunflower seeds. Pine nuts. Peanuts. Cod. Salmon. Scallops. Shrimp. Tuna. Tilapia. Clams. Oysters. Eggs. ? Dairy: Low-fat milk. Cheese. Greek yogurt. ? Beverages: Water. Red wine. Herbal tea. ? Fats and oils: Extra virgin olive oil. Avocado oil. Grape seed oil. ? Sweets and desserts: Greek yogurt with honey. Baked apples. Poached pears. Trail mix. ? Seasoning and other foods: Basil. Cilantro. Coriander. Cumin. Mint. Parsley. Sage. Rosemary. Tarragon. Garlic. Oregano. Thyme. Pepper. Balsalmic vinegar. Tahini. Hummus. Tomato sauce. Olives. Mushrooms. ? Limit these ? Grains: Prepackaged pasta or rice dishes. Prepackaged cereal with added sugar. ? Vegetables: Deep fried potatoes (french fries). ? Fruits: Fruit canned in syrup. ? Meats and other protein foods: Beef. Pork. Lamb. Poultry with skin. Hot dogs. Bacon. ? Dairy: Ice cream. Sour cream. Whole milk. ? Beverages: Juice. Sugar-sweetened soft drinks. Beer. Liquor and spirits. ? Fats and oils: Butter. Canola oil. Vegetable oil. Beef fat (tallow). Lard. ? Sweets and desserts: Cookies. Cakes. Pies. Candy. ? Seasoning and other foods: Mayonnaise. Premade sauces and marinades. ? The items listed may not be a complete list. Talk with your dietitian about what dietary choices are right for you. Summary  The Mediterranean diet includes both food and lifestyle choices.  Eat a variety of fresh fruits and vegetables, beans, nuts, seeds, and whole grains.  Limit the amount of red meat and sweets that you eat.  Talk with your health care provider about whether it is safe for you to drink red wine in moderation. This means 1 glass a day for nonpregnant women and 2 glasses a day for men. A glass of wine equals 5 oz (150 mL). This information  is not intended to replace advice given to you by your health care provider. Make sure you discuss any questions you have with your health care provider. Document Released: 06/21/2016 Document Revised: 07/24/2016 Document Reviewed: 06/21/2016 Elsevier Interactive Patient Education  2018 Elsevier Inc. American Heart Association (AHA) Exercise Recommendation  Being physically active is important to prevent heart disease and stroke, the nation's No. 1and No. 5killers. To improve overall cardiovascular health, we suggest at least 150 minutes per week of moderate exercise or 75 minutes per week of vigorous exercise (or a combination of moderate and vigorous activity). Thirty minutes a day, five times a week is an easy goal to remember. You will also experience benefits even if you divide your time into two or three segments of 10 to 15 minutes per day.  For people who would benefit from lowering their blood pressure or cholesterol, we recommend 40 minutes of aerobic exercise of moderate to vigorous intensity three to four times a week to lower the risk for heart attack and stroke.  Physical activity is anything that makes you move your body and burn calories.  This includes things like climbing stairs or playing sports. Aerobic exercises benefit your heart, and include walking, jogging, swimming or biking. Strength and   stretching exercises are best for overall stamina and flexibility.  The simplest, positive change you can make to effectively improve your heart health is to start walking. It's enjoyable, free, easy, social and great exercise. A walking program is flexible and boasts high success rates because people can stick with it. It's easy for walking to become a regular and satisfying part of life.   For Overall Cardiovascular Health:  At least 30 minutes of moderate-intensity aerobic activity at least 5 days per week for a total of 150  OR   At least 25 minutes of vigorous aerobic activity  at least 3 days per week for a total of 75 minutes; or a combination of moderate- and vigorous-intensity aerobic activity  AND   Moderate- to high-intensity muscle-strengthening activity at least 2 days per week for additional health benefits.  For Lowering Blood Pressure and Cholesterol  An average 40 minutes of moderate- to vigorous-intensity aerobic activity 3 or 4 times per week  What if I can't make it to the time goal? Something is always better than nothing! And everyone has to start somewhere. Even if you've been sedentary for years, today is the day you can begin to make healthy changes in your life. If you don't think you'll make it for 30 or 40 minutes, set a reachable goal for today. You can work up toward your overall goal by increasing your time as you get stronger. Don't let all-or-nothing thinking rob you of doing what you can every day.  Source:http://www.heart.org   Preventive Care 40-64 Years, Female Preventive care refers to lifestyle choices and visits with your health care provider that can promote health and wellness. What does preventive care include?  A yearly physical exam. This is also called an annual well check.  Dental exams once or twice a year.  Routine eye exams. Ask your health care provider how often you should have your eyes checked.  Personal lifestyle choices, including: ? Daily care of your teeth and gums. ? Regular physical activity. ? Eating a healthy diet. ? Avoiding tobacco and drug use. ? Limiting alcohol use. ? Practicing safe sex. ? Taking low-dose aspirin daily starting at age 50. ? Taking vitamin and mineral supplements as recommended by your health care provider. What happens during an annual well check? The services and screenings done by your health care provider during your annual well check will depend on your age, overall health, lifestyle risk factors, and family history of disease. Counseling Your health care provider may  ask you questions about your:  Alcohol use.  Tobacco use.  Drug use.  Emotional well-being.  Home and relationship well-being.  Sexual activity.  Eating habits.  Work and work environment.  Method of birth control.  Menstrual cycle.  Pregnancy history.  Screening You may have the following tests or measurements:  Height, weight, and BMI.  Blood pressure.  Lipid and cholesterol levels. These may be checked every 5 years, or more frequently if you are over 50 years old.  Skin check.  Lung cancer screening. You may have this screening every year starting at age 55 if you have a 30-pack-year history of smoking and currently smoke or have quit within the past 15 years.  Fecal occult blood test (FOBT) of the stool. You may have this test every year starting at age 50.  Flexible sigmoidoscopy or colonoscopy. You may have a sigmoidoscopy every 5 years or a colonoscopy every 10 years starting at age 50.  Hepatitis C blood test.    Hepatitis B blood test.  Sexually transmitted disease (STD) testing.  Diabetes screening. This is done by checking your blood sugar (glucose) after you have not eaten for a while (fasting). You may have this done every 1-3 years.  Mammogram. This may be done every 1-2 years. Talk to your health care provider about when you should start having regular mammograms. This may depend on whether you have a family history of breast cancer.  BRCA-related cancer screening. This may be done if you have a family history of breast, ovarian, tubal, or peritoneal cancers.  Pelvic exam and Pap test. This may be done every 3 years starting at age 21. Starting at age 30, this may be done every 5 years if you have a Pap test in combination with an HPV test.  Bone density scan. This is done to screen for osteoporosis. You may have this scan if you are at high risk for osteoporosis.  Discuss your test results, treatment options, and if necessary, the need for more  tests with your health care provider. Vaccines Your health care provider may recommend certain vaccines, such as:  Influenza vaccine. This is recommended every year.  Tetanus, diphtheria, and acellular pertussis (Tdap, Td) vaccine. You may need a Td booster every 10 years.  Varicella vaccine. You may need this if you have not been vaccinated.  Zoster vaccine. You may need this after age 60.  Measles, mumps, and rubella (MMR) vaccine. You may need at least one dose of MMR if you were born in 1957 or later. You may also need a second dose.  Pneumococcal 13-valent conjugate (PCV13) vaccine. You may need this if you have certain conditions and were not previously vaccinated.  Pneumococcal polysaccharide (PPSV23) vaccine. You may need one or two doses if you smoke cigarettes or if you have certain conditions.  Meningococcal vaccine. You may need this if you have certain conditions.  Hepatitis A vaccine. You may need this if you have certain conditions or if you travel or work in places where you may be exposed to hepatitis A.  Hepatitis B vaccine. You may need this if you have certain conditions or if you travel or work in places where you may be exposed to hepatitis B.  Haemophilus influenzae type b (Hib) vaccine. You may need this if you have certain conditions.  Talk to your health care provider about which screenings and vaccines you need and how often you need them. This information is not intended to replace advice given to you by your health care provider. Make sure you discuss any questions you have with your health care provider. Document Released: 11/25/2015 Document Revised: 07/18/2016 Document Reviewed: 08/30/2015 Elsevier Interactive Patient Education  2018 Elsevier Inc.  

## 2018-09-16 NOTE — Progress Notes (Signed)
Patient ID: Amanda Price, female   DOB: [birth year], 52 y.o.   MRN: [id number]      Gynecology Annual Exam  PCP: Burnard Hawthorne, FNP  Chief Complaint:  Chief Complaint  Patient presents with  . Gynecologic Exam    History of Present Illness:Patient is a 52 y.o. [id number] presents for annual exam. The patient has no complaints today. She has recently been seen by her PCP for her hypertension/fluid retention issues and ongoing problems with knee pain. She was restarted on HCTZ. She thinks she will need replacement surgery. She also had lab work done. She has noticed that she no longer feels the lump in her left breast that has been evaluated in the past. Her next mammogram is in 2 weeks.  LMP: No LMP recorded. (Menstrual status: IUD). Menarche:not applicable  Postcoital Bleeding: no Dysmenorrhea: not applicable  The patient is not currently sexually active. She denies dyspareunia.  The patient does perform self breast exams.  There is no notable family history of breast or ovarian cancer in her family.  The patient wears seatbelts: yes.   The patient has regular exercise: She works on [place] and also on flexibility exercises. She has been trying to lose some weight to help with her joint pain.    The patient denies current symptoms of depression.     Review of Systems: Review of Systems  Constitutional: Negative.   HENT: Negative.   Eyes: Negative.   Respiratory: Negative.   Cardiovascular: Negative.   Gastrointestinal: Negative.   Genitourinary: Negative.   Musculoskeletal: Positive for joint pain.  Skin: Negative.   Neurological: Negative.   Endo/Heme/Allergies: Negative.   Psychiatric/Behavioral: Negative.     Past Medical History:  Past Medical History:  Diagnosis Date  . Frequent headaches   . H/O breast biopsy    Left Breast  . Hypertension     Past Surgical History:  Past Surgical History:  Procedure Laterality Date  . BREAST BIOPSY Left    needle per  pt  . BREAST SURGERY     Breast biopsy  . COLONOSCOPY N/A 09/26/2015   Procedure: COLONOSCOPY;  Surgeon: Lollie Sails, MD;  Location[facility] ENDOSCOPY;  Service: Endoscopy;  Laterality: N/A;  . INTRAUTERINE DEVICE (IUD) INSERTION  04/2014  . KNEE SURGERY Right     Gynecologic History:  No LMP recorded. (Menstrual status: IUD). Last Pap: 2 years ago Results were:  no abnormalities  Last mammogram: 1 year ago Results were: BI-RADS IV probably benign  Obstetric History: [id number]  Family History:  Family History  Problem Relation Age of Onset  . Healthy Mother   . Healthy Father   . Hypertension Sister   . Diabetes Sister   . Hypertension Sister   . Diabetes Sister   . Hypertension Sister   . Diabetes Sister   . Breast cancer Neg Hx     Social History:  Social History   Socioeconomic History  . Marital status: Single    Spouse name: Not on file  . Number of children: 2  . Years of education: 38  . Highest education level: Not on file  Occupational History  . Occupation: Environmental health practitioner  . Occupation: Freight forwarder  Social Needs  . Financial resource strain: Not on file  . Food insecurity:    Worry: Not on file    Inability: Not on file  . Transportation needs:    Medical: Not on file    Non-medical: Not on file  Tobacco Use  .  Smoking status: Never Smoker  . Smokeless tobacco: Never Used  Substance and Sexual Activity  . Alcohol use: Yes    Alcohol/week: 0.0 standard drinks    Comment: Occasionally  . Drug use: No  . Sexual activity: Yes    Birth control/protection: IUD  Lifestyle  . Physical activity:    Days per week: Not on file    Minutes per session: Not on file  . Stress: Not on file  Relationships  . Social connections:    Talks on phone: Not on file    Gets together: Not on file    Attends religious service: Not on file    Active member of club or organization: Not on file    Attends meetings of clubs or organizations: Not on file     Relationship status: Not on file  . Intimate partner violence:    Fear of current or ex partner: Not on file    Emotionally abused: Not on file    Physically abused: Not on file    Forced sexual activity: Not on file  Other Topics Concern  . Not on file  Social History Narrative   Born in [place].    Living in [place].   Freight forwarder in plant.    Has two sons. One with lives her.       Allergies:  No Known Allergies  Medications: Prior to Admission medications   Medication Sig Start Date End Date Taking? Authorizing Provider  levonorgestrel (MIRENA) 20 MCG/24HR IUD 1 each by Intrauterine route once. 04/16/14 04/17/19 Yes [provider]  lisinopril-hydrochlorothiazide (PRINZIDE,ZESTORETIC) 10-12.5 MG tablet Take 1 tablet by mouth daily. 09/11/18  Yes Guse, Jacquelynn Cree, FNP  XOLEGEL 2 % GEL APPLY TO THE AFFECTED AREA(S) TWICE DAILY 07/26/17  Yes [provider]  valACYclovir (VALTREX) 500 MG tablet Take 1 tablet (500 mg total) by mouth daily. 09/16/18   Rod Can, CNM    Physical Exam Vitals: Blood pressure 134/90, pulse (!) 126, height _0  (1.6 m), weight 197 lb (89.4 kg).  General: NAD HEENT: normocephalic, anicteric Thyroid: no enlargement, no palpable nodules Pulmonary: No increased work of breathing, CTAB Cardiovascular: RRR, distal pulses 2+ Breast: Breast symmetrical, no tenderness, no palpable nodules or masses, no skin or nipple retraction present, no nipple discharge.  No axillary or supraclavicular lymphadenopathy. Abdomen: NABS, soft, non-tender, non-distended.  Umbilicus without lesions.  No hepatomegaly, splenomegaly or masses palpable. No evidence of hernia  Genitourinary:  External: Normal external female genitalia.  Normal urethral meatus, normal Bartholin's and Skene's glands.    Vagina: Normal vaginal mucosa, no evidence of prolapse.    Cervix: Grossly normal in appearance, no bleeding, no CMT  Uterus: Non-enlarged, mobile, normal  contour.    Adnexa: ovaries non-enlarged, no adnexal masses  Rectal: deferred  Lymphatic: no evidence of inguinal lymphadenopathy Extremities: no edema, erythema, or tenderness Neurologic: Grossly intact Psychiatric: mood appropriate, affect full    Assessment: 52 y.o. [id number] routine annual exam  Plan: Problem List Items Addressed This Visit      Other   HSV (herpes simplex virus) infection   Relevant Medications   valACYclovir (VALTREX) 500 MG tablet    Other Visit Diagnoses    Well woman exam with routine gynecological exam    -  Primary   Relevant Orders   STD Panel   Cervicovaginal ancillary only   Screen for sexually transmitted diseases       Relevant Orders   STD Panel   Cervicovaginal ancillary  only      1) Mammogram - recommend yearly screening mammogram.  Mammogram is scheduled  2) STI screening  was offered and accepted  3) ASCCP guidelines and rationale discussed.  Patient opts for every 3 years screening interval  4) Osteoporosis  - per USPTF routine screening DEXA at age 101   Consider FDA-approved medical therapies in postmenopausal women and men aged 89 years and older, based on the following: a) A hip or vertebral (clinical or morphometric) fracture b) T-score ? -2.5 at the femoral neck or spine after appropriate evaluation to exclude secondary causes C) Low bone mass (T-score between -1.0 and -2.5 at the femoral neck or spine) and a 10-year probability of a hip fracture ? 3% or a 10-year probability of a major osteoporosis-related fracture ? 20% based on the [place]-adapted WHO algorithm   5) Routine healthcare maintenance including cholesterol, diabetes screening discussed managed by PCP  6) Colonoscopy up to date.  Screening recommended starting at age 67 for average risk individuals, age 68 for individuals deemed at increased risk (including African Americans) and recommended to continue until age 72.  For patient age 59-85 individualized approach is  recommended.  Gold standard screening is via colonoscopy, Cologuard screening is an acceptable alternative for patient unwilling or unable to undergo colonoscopy.  "Colorectal cancer screening for average?risk adults: 2018 guideline update from the [place]: A Cancer Journal for Clinicians: Apr 10, 2017   7) Return in 1 year (on 09/17/2019) for annual established gyn.    Rod Can, CNM [facility], [facility] 09/16/2018, 9:27 AM

## 2018-09-17 LAB — CERVICOVAGINAL ANCILLARY ONLY
Chlamydia: POSITIVE — AB
Neisseria Gonorrhea: NEGATIVE
Trichomonas: NEGATIVE

## 2018-09-18 LAB — RPR+HSVIGM+HBSAG+HSV2(IGG)+...
HIV Screen 4th Generation wRfx: NONREACTIVE
HSV 2 IgG, Type Spec: 8.34 index — ABNORMAL HIGH (ref 0.00–0.90)
HSVI/II Comb IgM: <0.91 Ratio (ref 0.00–0.90)
Hepatitis B Surface Ag: NEGATIVE
RPR Ser Ql: NONREACTIVE

## 2018-09-20 ENCOUNTER — Other Ambulatory Visit: Payer: Self-pay | Admitting: Advanced Practice Midwife

## 2018-09-20 DIAGNOSIS — A749 Chlamydial infection, unspecified: Secondary | ICD-10-CM

## 2018-09-20 MED ORDER — AZITHROMYCIN 500 MG PO TABS: 1000.0000 mg | ORAL_TABLET | Freq: Once | ORAL | 0 refills | Status: AC

## 2018-09-20 NOTE — Progress Notes (Signed)
Rx azithromycin sent to patient pharmacy. Left voicemail for patient regarding treatment.

## 2018-09-25 ENCOUNTER — Ambulatory Visit: Payer: 59 | Admitting: Family Medicine

## 2018-09-25 ENCOUNTER — Encounter: Payer: Self-pay | Admitting: Family Medicine

## 2018-09-25 VITALS — BP 142/96 | HR 97 | Temp 98.2°F | Ht 63.0 in | Wt 199.8 lb

## 2018-09-25 DIAGNOSIS — I10 Essential (primary) hypertension: Secondary | ICD-10-CM

## 2018-09-25 DIAGNOSIS — M1711 Unilateral primary osteoarthritis, right knee: Secondary | ICD-10-CM | POA: Diagnosis not present

## 2018-09-25 NOTE — Progress Notes (Signed)
Subjective:    Patient ID: Amanda Price, female    DOB: 12/28/1965, 52 y.o.   MRN: [id number]  HPI  Presents to clinic to discuss BP, currently on lisinopril 10mg  - HCTZ 12.5 mg.  Patient had been off of her BP meds for a few months due to running out, and recently started back on her medication.  Patient denies any adverse side effects with this medication, denies chest pain denies shortness of breath, denies lower extremity swelling.  Patient continues to have pain in right knee, x-ray done at last visit did show severe osteoarthritis.   Patient Active Problem List   Diagnosis Date Noted  . HSV (herpes simplex virus) infection 09/16/2018  . Encounter to establish care 09/17/2016  . Essential hypertension 06/16/2015   Social History   Tobacco Use  . Smoking status: Never Smoker  . Smokeless tobacco: Never Used  Substance Use Topics  . Alcohol use: Yes    Alcohol/week: 0.0 standard drinks    Comment: Occasionally   Review of Systems   Constitutional: Negative for chills, fatigue and fever.  HENT: Negative for congestion, ear pain, sinus pain and sore throat.   Eyes: Negative.   Respiratory: Negative for cough, shortness of breath and wheezing.   Cardiovascular: Negative for chest pain, palpitations and leg swelling.  Gastrointestinal: Negative for abdominal pain, diarrhea, nausea and vomiting.  Genitourinary: Negative for dysuria, frequency and urgency.  Musculoskeletal: +right knee pain  Skin: Negative for color change, pallor and rash.  Neurological: Negative for syncope, light-headedness and headaches.  Psychiatric/Behavioral: The patient is not nervous/anxious.        Objective:   Physical Exam  Constitutional:  She appears well-developed and well-nourished. No distress.  HENT:  Head: Normocephalic and atraumatic.  Eyes: Pupils are equal, round, and reactive to light. EOM are normal. No scleral icterus.  Neck: Normal range of motion. Neck supple. No tracheal  deviation present.  Cardiovascular: Normal rate, regular rhythm and normal heart sounds.  Pulmonary/Chest: Effort normal and breath sounds normal. No respiratory distress. She has no wheezes. She has no rales.  Abdominal: Soft. Bowel sounds are normal. There is no tenderness.  Neurological: She is alert and oriented to person, place, and time.  Musculoskeletal: +right knee pain, tenderness with ROM of knee joint. Left knee also tender with motion, patient more concerned with right knee now Skin: Skin is warm and dry. No pallor.  Psychiatric: She has a normal mood and affect. Her behavior is normal. Thought content normal.   Nursing note and vitals reviewed.    Vitals:   09/25/18 0927 09/25/18 0947  BP: (!) 140/100 (!) 142/96  Pulse: 97   Temp: 98.2 F (36.8 C)   SpO2: 98%    Assessment & Plan:   Essential hypertension- discussed with patient different options for improving her blood pressure control.  Patient is teetering on the edge of our goal with her blood pressure readings in the 140s over 90s.  Patient would like to give herself some more time with taking the lisinopril hydrochlorothiazide before adding on a new medication.  Patient will continue to work on healthy diet choices, reduce salt intake-discussed a DASH diet.  Severe osteoarthritis right knee- orthopedic referral given.  Due to severe OA seen in right knee area, suspect it is in left as well.  Patient would like to focus just on right knee at this time.  Follow-up in 2 weeks for nurse visit blood pressure check.  Patient aware that if her  blood pressure continues to run in the 140s over 90s we will add something else to better control her blood pressure such as an amlodipine or we can increase dose of lisinopril slightly.

## 2018-09-25 NOTE — Patient Instructions (Signed)
Continue lisinopril hydrochlorothiazide at current dose.  Follow-up in clinic in 2 weeks for nurse visit to check blood pressure.  If blood pressure continues to remain in the 140s over 90s range, we will add another medication to the regimen to better control blood pressure

## 2018-10-01 ENCOUNTER — Ambulatory Visit
Admission: RE | Admit: 2018-10-01 | Discharge: 2018-10-01 | Disposition: A | Discharge status: Home / Self Care | Payer: 59 | Source: Ambulatory Visit | Attending: Obstetrics & Gynecology | Admitting: Obstetrics & Gynecology

## 2018-10-01 DIAGNOSIS — N6320 Unspecified lump in the left breast, unspecified quadrant: Secondary | ICD-10-CM | POA: Insufficient documentation

## 2018-10-02 ENCOUNTER — Encounter: Payer: Self-pay | Admitting: Obstetrics & Gynecology

## 2018-10-03 ENCOUNTER — Telehealth: Payer: Self-pay | Admitting: Advanced Practice Midwife

## 2018-10-03 NOTE — Telephone Encounter (Signed)
Patient is calling to speak with Erskine SquibbJane. Patient left no message. Please advise

## 2018-10-03 NOTE — Telephone Encounter (Signed)
Attempted to return patient's call. No answer. Left message on voicemail.

## 2018-10-14 ENCOUNTER — Ambulatory Visit: Payer: 59

## 2019-01-08 ENCOUNTER — Other Ambulatory Visit: Payer: Self-pay | Admitting: Family Medicine

## 2019-01-08 DIAGNOSIS — I10 Essential (primary) hypertension: Secondary | ICD-10-CM

## 2019-01-11 HISTORY — PX: JOINT REPLACEMENT: SHX530

## 2019-05-06 ENCOUNTER — Ambulatory Visit: Payer: 59 | Admitting: Advanced Practice Midwife

## 2019-05-13 ENCOUNTER — Ambulatory Visit: Payer: 59 | Admitting: Advanced Practice Midwife

## 2019-05-18 ENCOUNTER — Other Ambulatory Visit: Payer: Self-pay | Admitting: Family Medicine

## 2019-05-18 DIAGNOSIS — I10 Essential (primary) hypertension: Secondary | ICD-10-CM

## 2019-05-19 ENCOUNTER — Encounter: Payer: Self-pay | Admitting: Advanced Practice Midwife

## 2019-05-19 ENCOUNTER — Ambulatory Visit (INDEPENDENT_AMBULATORY_CARE_PROVIDER_SITE_OTHER): Payer: 59 | Admitting: Advanced Practice Midwife

## 2019-05-19 ENCOUNTER — Other Ambulatory Visit: Payer: Self-pay

## 2019-05-19 DIAGNOSIS — Z30432 Encounter for removal of intrauterine contraceptive device: Secondary | ICD-10-CM | POA: Diagnosis not present

## 2019-05-19 NOTE — Progress Notes (Signed)
    GYNECOLOGY OFFICE PROCEDURE NOTE  Amanda Price is a 53 y.o. [id number] here for IUD removal. The patient currently has a Mirena IUD placed 5 years ago.  No GYN concerns.  Last pap smear was on 08/15/2016 and was normal. She has not had any symptoms of menopause such as hot flashes or vaginal dryness. She is ready to have hormone free period to see what her menopause status is. She is encouraged to use condoms in the off chance that she could ovulate and conceive.  IUD Removal and Reinsertion  Patient identified, informed consent performed, consent signed.   Discussed risks of irregular bleeding, cramping. Time out was performed. Speculum placed in the vagina. The strings of the IUD were grasped and pulled using ring forceps. The IUD was successfully removed in its entirety.  Patient tolerated procedure well.   Vital Signs: BP (!) 150/98   Pulse 99   Ht 5\' 3"  (1.6 m)   Wt 199 lb (90.3 kg)   BMI 35.25 kg/m  Constitutional: Well nourished, well developed female in no acute distress.  HEENT: normal Skin: Warm and dry.  Cardiovascular: Regular rate and rhythm.   Respiratory: Clear to auscultation bilateral. Normal respiratory effort Neuro: DTRs 2+, Cranial nerves grossly intact Psych: Alert and Oriented x3. No memory deficits. Normal mood and affect.  MS: normal gait, normal bilateral lower extremity ROM/strength/stability.  Pelvic exam:  is not limited by body habitus EGBUS: within normal limits Vagina: within normal limits and with normal mucosa  Cervix: normal appearance  Assessment/Plan: 53 yo G4 P21 female s/p IUD removal  Return to clinic as needed and for regular annual exam     Rod Can, [place]  IUD insertion CPT 58300,  Skyla Heath [place] IUD remval [id number], plus Modifer 79 is done during a global billing visit

## 2019-09-07 ENCOUNTER — Other Ambulatory Visit: Payer: Self-pay | Admitting: *Deleted

## 2019-09-07 ENCOUNTER — Other Ambulatory Visit: Payer: Self-pay | Admitting: Obstetrics & Gynecology

## 2019-09-07 ENCOUNTER — Other Ambulatory Visit: Payer: Self-pay | Admitting: Advanced Practice Midwife

## 2019-09-24 ENCOUNTER — Other Ambulatory Visit: Payer: Self-pay

## 2019-09-24 ENCOUNTER — Ambulatory Visit (INDEPENDENT_AMBULATORY_CARE_PROVIDER_SITE_OTHER): Payer: 59 | Admitting: Advanced Practice Midwife

## 2019-09-24 ENCOUNTER — Encounter: Payer: Self-pay | Admitting: Advanced Practice Midwife

## 2019-09-24 ENCOUNTER — Telehealth: Payer: Self-pay | Admitting: Internal Medicine

## 2019-09-24 ENCOUNTER — Other Ambulatory Visit (HOSPITAL_COMMUNITY)
Admission: RE | Admit: 2019-09-24 | Discharge: 2019-09-24 | Disposition: A | Discharge status: Home / Self Care | Payer: 59 | Source: Ambulatory Visit | Attending: Advanced Practice Midwife | Admitting: Advanced Practice Midwife

## 2019-09-24 VITALS — BP 159/97 | HR 96 | Ht 63.0 in | Wt 195.0 lb

## 2019-09-24 DIAGNOSIS — Z01419 Encounter for gynecological examination (general) (routine) without abnormal findings: Secondary | ICD-10-CM | POA: Insufficient documentation

## 2019-09-24 DIAGNOSIS — G8929 Other chronic pain: Secondary | ICD-10-CM

## 2019-09-24 DIAGNOSIS — Z124 Encounter for screening for malignant neoplasm of cervix: Secondary | ICD-10-CM | POA: Diagnosis present

## 2019-09-24 DIAGNOSIS — I10 Essential (primary) hypertension: Secondary | ICD-10-CM

## 2019-09-24 DIAGNOSIS — Z1239 Encounter for other screening for malignant neoplasm of breast: Secondary | ICD-10-CM

## 2019-09-24 LAB — HM PAP SMEAR: HM Pap smear: NORMAL

## 2019-09-24 NOTE — Progress Notes (Signed)
Gynecology Annual Exam  PCP: Burnard Hawthorne, FNP  Chief Complaint:  Chief Complaint  Patient presents with  . Gynecologic Exam    History of Present Illness:Patient is a 53 y.o. [id number] presents for annual exam. The patient has complaint today of ongoing knee pain and arthritis. She is requesting a referral for orthopedics. Her breasts are tender. There are no concerns for breast lumps. Since removing her IUD in July she had 2 days of spotting right away and no other bleeding since then. We discussed diagnosis of post menopause after a year of no bleeding. She ran out of her blood pressure medicine a few days ago. She plans to contact her PCP for a refill.    LMP: No LMP recorded.  Intermenstrual Bleeding: not applicable Postcoital Bleeding: not applicable Dysmenorrhea: not applicable  The patient is not currently sexually active. She denies dyspareunia.  The patient does perform self breast exams.  There is no notable family history of breast or ovarian cancer in her family.  The patient wears seatbelts: yes.   The patient has regular exercise: She is active at work and she uses elyptical and weights at home. She tries to eat a healthy diet. She drinks some water but complains of having to use the bathroom too much. She admits 7-8 hours of sleep- works night shift.    The patient denies current symptoms of depression.     Review of Systems: Review of Systems  Constitutional: Negative.   HENT: Negative.   Eyes: Negative.   Respiratory: Negative.   Cardiovascular: Negative.   Gastrointestinal: Negative.   Genitourinary: Negative.   Musculoskeletal: Positive for joint pain.  Skin: Negative.   Neurological: Negative.   Endo/Heme/Allergies: Negative.   Psychiatric/Behavioral: Negative.     Past Medical History:  Past Medical History:  Diagnosis Date  . Frequent headaches   . H/O breast biopsy    Left Breast  . Hypertension     Past Surgical History:  Past  Surgical History:  Procedure Laterality Date  . BREAST BIOPSY Left    needle per pt  . BREAST SURGERY     Breast biopsy  . COLONOSCOPY N/A 09/26/2015   Procedure: COLONOSCOPY;  Surgeon: Lollie Sails, MD;  [facility];  Service: Endoscopy;  Laterality: N/A;  . INTRAUTERINE DEVICE (IUD) INSERTION  04/2014  . KNEE SURGERY Right     Gynecologic History:  No LMP recorded. Last Pap: 3 years ago Results were:  no abnormalities  Last mammogram: 1 year ago Results were: BI-RAD II, benign  Obstetric History: [id number]  Family History:  Family History  Problem Relation Age of Onset  . Healthy Mother   . Healthy Father   . Hypertension Sister   . Diabetes Sister   . Hypertension Sister   . Diabetes Sister   . Hypertension Sister   . Diabetes Sister   . Breast cancer Neg Hx     Social History:  Social History   Socioeconomic History  . Marital status: Single    Spouse name: Not on file  . Number of children: 2  . Years of education: 68  . Highest education level: Not on file  Occupational History  . Occupation: Environmental health practitioner  . Occupation: Freight forwarder  Social Needs  . Financial resource strain: Not on file  . Food insecurity    Worry: Not on file    Inability: Not on file  . Transportation needs    Medical: Not on  file    Non-medical: Not on file  Tobacco Use  . Smoking status: Never Smoker  . Smokeless tobacco: Never Used  Substance and Sexual Activity  . Alcohol use: Yes    Alcohol/week: 0.0 standard drinks    Comment: Occasionally  . Drug use: No  . Sexual activity: Yes  Lifestyle  . Physical activity    Days per week: Not on file    Minutes per session: Not on file  . Stress: Not on file  Relationships  . Social Herbalist on phone: Not on file    Gets together: Not on file    Attends religious service: Not on file    Active member of club or organization: Not on file    Attends meetings of clubs or organizations: Not on  file    Relationship status: Not on file  . Intimate partner violence    Fear of current or ex partner: Not on file    Emotionally abused: Not on file    Physically abused: Not on file    Forced sexual activity: Not on file  Other Topics Concern  . Not on file  Social History Narrative   Born in [place].    Living in [place].   Freight forwarder in plant.    Has two sons. One with lives her.       Allergies:  No Known Allergies  Medications: Prior to Admission medications   Medication Sig Start Date End Date Taking? Authorizing Provider  lisinopril-hydrochlorothiazide (ZESTORETIC) 10-12.5 MG tablet TAKE 1 TABLET BY MOUTH DAILY 05/18/19  Yes Guse, Jacquelynn Cree, FNP  valACYclovir (VALTREX) 500 MG tablet Take 1 tablet (500 mg total) by mouth daily. 09/16/18  Yes Rod Can, CNM  XOLEGEL 2 % GEL APPLY TO THE AFFECTED AREA(S) TWICE DAILY 07/26/17  Yes [provider]    Physical Exam Vitals: Blood pressure (!) 159/97, pulse 96, height _0  (1.6 m), weight 195 lb (88.5 kg).  General: NAD HEENT: normocephalic, anicteric Thyroid: no enlargement, no palpable nodules Pulmonary: No increased work of breathing, CTAB Cardiovascular: RRR, distal pulses 2+ Breast: Breast symmetrical, no tenderness, no palpable nodules or masses, no skin or nipple retraction present, no nipple discharge.  No axillary or supraclavicular lymphadenopathy. Abdomen: NABS, soft, non-tender, non-distended.  Umbilicus without lesions.  No hepatomegaly, splenomegaly or masses palpable. No evidence of hernia  Genitourinary:  External: Normal external female genitalia.  Normal urethral meatus, normal Bartholin's and Skene's glands.    Vagina: Normal vaginal mucosa, no evidence of prolapse.    Cervix: Grossly normal in appearance, friable, mildly tender to PAP smear collection  Uterus: deferred for no concerns  Adnexa: deferred for no concerns  Rectal: deferred  Lymphatic: no evidence of inguinal lymphadenopathy  Extremities: no edema, erythema, or tenderness Neurologic: Grossly intact Psychiatric: mood appropriate, affect full     Assessment: 53 y.o. [id number] routine annual exam  Plan: Problem List Items Addressed This Visit    None    Visit Diagnoses    Well woman exam with routine gynecological exam    -  Primary   Relevant Orders   Cytology - PAP   Mammogram Screening Routine   Cervical cancer screening       Relevant Orders   Cytology - PAP   Encounter for screening for malignant neoplasm of breast, unspecified screening modality       Relevant Orders   Mammogram Screening Routine   Chronic knee pain, unspecified laterality  Relevant Orders   AMB referral to orthopedics      1) Mammogram - recommend yearly screening mammogram.  Mammogram Was ordered today  2) STI screening  was offered and declined  3) ASCCP guidelines and rationale discussed.  Patient opts for every 3 years screening interval  4) Osteoporosis  - per USPTF routine screening DEXA at age 64   Consider FDA-approved medical therapies in postmenopausal women and men aged 54 years and older, based on the following: a) A hip or vertebral (clinical or morphometric) fracture b) T-score ? -2.5 at the femoral neck or spine after appropriate evaluation to exclude secondary causes C) Low bone mass (T-score between -1.0 and -2.5 at the femoral neck or spine) and a 10-year probability of a hip fracture ? 3% or a 10-year probability of a major osteoporosis-related fracture ? 20% based on the [place]-adapted WHO algorithm   5) Routine healthcare maintenance including cholesterol, diabetes screening discussed managed by PCP  6) Colonoscopy: up to date- next due in 2026.  Screening recommended starting at age 32 for average risk individuals, age 33 for individuals deemed at increased risk (including African Americans) and recommended to continue until age 15.  For patient age 58-85 individualized approach is recommended.   Gold standard screening is via colonoscopy, Cologuard screening is an acceptable alternative for patient unwilling or unable to undergo colonoscopy.  "Colorectal cancer screening for average?risk adults: 2018 guideline update from the [employer]: A Cancer Journal for Clinicians: Apr 10, 2017   7) Return in about 1 year (around 09/23/2020) for annual established gyn.    Rod Can, [facility] 09/24/2019, 10:02 AM

## 2019-09-24 NOTE — Telephone Encounter (Signed)
I was reviewing [name] charts in Amanda Price's box.  Amanda Price's gyn note, mentioned that she was out of blood pressure medication.  Blood pressure elevated at that visit.  Do you mind contacting pt and arranging a f/u regarding her blood pressure and make sure she gets her medication.   Also, she is overdue labs.  Thanks.    Dr Nicki Reaper

## 2019-09-24 NOTE — Patient Instructions (Signed)
Health Maintenance, Female Adopting a healthy lifestyle and getting preventive care are important in promoting health and wellness. Ask your health care provider about:  The right schedule for you to have regular tests and exams.  Things you can do on your own to prevent diseases and keep yourself healthy. What should I know about diet, weight, and exercise? Eat a healthy diet   Eat a diet that includes plenty of vegetables, fruits, low-fat dairy products, and lean protein.  Do not eat a lot of foods that are high in solid fats, added sugars, or sodium. Maintain a healthy weight Body mass index (BMI) is used to identify weight problems. It estimates body fat based on height and weight. Your health care provider can help determine your BMI and help you achieve or maintain a healthy weight. Get regular exercise Get regular exercise. This is one of the most important things you can do for your health. Most adults should:  Exercise for at least 150 minutes each week. The exercise should increase your heart rate and make you sweat (moderate-intensity exercise).  Do strengthening exercises at least twice a week. This is in addition to the moderate-intensity exercise.  Spend less time sitting. Even light physical activity can be beneficial. Watch cholesterol and blood lipids Have your blood tested for lipids and cholesterol at 54 years of age, then have this test every 5 years. Have your cholesterol levels checked more often if:  Your lipid or cholesterol levels are high.  You are older than 54 years of age.  You are at high risk for heart disease. What should I know about cancer screening? Depending on your health history and family history, you may need to have cancer screening at various ages. This may include screening for:  Breast cancer.  Cervical cancer.  Colorectal cancer.  Skin cancer.  Lung cancer. What should I know about heart disease, diabetes, and high blood  pressure? Blood pressure and heart disease  High blood pressure causes heart disease and increases the risk of stroke. This is more likely to develop in people who have high blood pressure readings, are of African descent, or are overweight.  Have your blood pressure checked: ? Every 3-5 years if you are 18-39 years of age. ? Every year if you are 40 years old or older. Diabetes Have regular diabetes screenings. This checks your fasting blood sugar level. Have the screening done:  Once every three years after age 40 if you are at a normal weight and have a low risk for diabetes.  More often and at a younger age if you are overweight or have a high risk for diabetes. What should I know about preventing infection? Hepatitis B If you have a higher risk for hepatitis B, you should be screened for this virus. Talk with your health care provider to find out if you are at risk for hepatitis B infection. Hepatitis C Testing is recommended for:  Everyone born from 1945 through 1965.  Anyone with known risk factors for hepatitis C. Sexually transmitted infections (STIs)  Get screened for STIs, including gonorrhea and chlamydia, if: ? You are sexually active and are younger than 54 years of age. ? You are older than 54 years of age and your health care provider tells you that you are at risk for this type of infection. ? Your sexual activity has changed since you were last screened, and you are at increased risk for chlamydia or gonorrhea. Ask your health care provider if   you are at risk.  Ask your health care provider about whether you are at high risk for HIV. Your health care provider may recommend a prescription medicine to help prevent HIV infection. If you choose to take medicine to prevent HIV, you should first get tested for HIV. You should then be tested every 3 months for as long as you are taking the medicine. Pregnancy  If you are about to stop having your period (premenopausal) and  you may become pregnant, seek counseling before you get pregnant.  Take 400 to 800 micrograms (mcg) of folic acid every day if you become pregnant.  Ask for birth control (contraception) if you want to prevent pregnancy. Osteoporosis and menopause Osteoporosis is a disease in which the bones lose minerals and strength with aging. This can result in bone fractures. If you are 65 years old or older, or if you are at risk for osteoporosis and fractures, ask your health care provider if you should:  Be screened for bone loss.  Take a calcium or vitamin D supplement to lower your risk of fractures.  Be given hormone replacement therapy (HRT) to treat symptoms of menopause. Follow these instructions at home: Lifestyle  Do not use any products that contain nicotine or tobacco, such as cigarettes, e-cigarettes, and chewing tobacco. If you need help quitting, ask your health care provider.  Do not use street drugs.  Do not share needles.  Ask your health care provider for help if you need support or information about quitting drugs. Alcohol use  Do not drink alcohol if: ? Your health care provider tells you not to drink. ? You are pregnant, may be pregnant, or are planning to become pregnant.  If you drink alcohol: ? Limit how much you use to 0-1 drink a day. ? Limit intake if you are breastfeeding.  Be aware of how much alcohol is in your drink. In the [place] one drink equals one 12 oz bottle of beer (355 mL), one 5 oz glass of wine (148 mL), or one 1 oz glass of hard liquor (44 mL). General instructions  Schedule regular health, dental, and eye exams.  Stay current with your vaccines.  Tell your health care provider if: ? You often feel depressed. ? You have ever been abused or do not feel safe at home. Summary  Adopting a healthy lifestyle and getting preventive care are important in promoting health and wellness.  Follow your health care provider's instructions about healthy  diet, exercising, and getting tested or screened for diseases.  Follow your health care provider's instructions on monitoring your cholesterol and blood pressure. This information is not intended to replace advice given to you by your health care provider. Make sure you discuss any questions you have with your health care provider. Document Released: 05/14/2011 Document Revised: 10/22/2018 Document Reviewed: 10/22/2018 Elsevier Patient Education  2020 Elsevier Inc.  

## 2019-09-25 LAB — CYTOLOGY - PAP
Comment: NEGATIVE
Diagnosis: NEGATIVE
High risk HPV: NEGATIVE

## 2019-09-25 NOTE — Telephone Encounter (Signed)
Left message for patient to call office.  

## 2019-09-28 ENCOUNTER — Telehealth: Payer: Self-pay | Admitting: Internal Medicine

## 2019-09-28 MED ORDER — LISINOPRIL-HYDROCHLOROTHIAZIDE 10-12.5 MG PO TABS: 1.0000 | ORAL_TABLET | Freq: Every day | ORAL | 0 refills | Status: DC

## 2019-09-28 NOTE — Telephone Encounter (Signed)
Patient has appointment scheduled with you to follow up on BP and for labs until PCP is back in office.

## 2019-09-28 NOTE — Telephone Encounter (Signed)
Ok to refill - please forward to [facility].  Will need labs checked as well.

## 2019-09-28 NOTE — Addendum Note (Signed)
Addended by: Nanci Pina on: 09/28/2019 03:26 PM   Modules accepted: Orders

## 2019-09-28 NOTE — Telephone Encounter (Signed)
Patient has appointment scheduled with you to follow up on BP and for labs until PCP is back in office. 

## 2019-09-28 NOTE — Telephone Encounter (Signed)
Patient called and is returning Barnes call from Friday. She said she missed her call and is just now hearing the VM. Please call patient back, thanks.

## 2019-09-28 NOTE — Telephone Encounter (Signed)
I have scheduled patient  For FU on BP 09/30/19 with NP ok to fill BP medication for 1 week until visit and labs.

## 2019-09-29 ENCOUNTER — Other Ambulatory Visit: Payer: Self-pay

## 2019-09-29 ENCOUNTER — Ambulatory Visit: Payer: 59 | Admitting: Family Medicine

## 2019-09-29 DIAGNOSIS — I10 Essential (primary) hypertension: Secondary | ICD-10-CM

## 2019-09-29 MED ORDER — LISINOPRIL-HYDROCHLOROTHIAZIDE 10-12.5 MG PO TABS: 1.0000 | ORAL_TABLET | Freq: Every day | ORAL | 0 refills | Status: DC

## 2019-10-07 ENCOUNTER — Telehealth: Payer: Self-pay

## 2019-10-07 NOTE — Telephone Encounter (Signed)
Copied from [place] [id number]. Topic: General - Other >> Oct 07, 2019  8:28 AM Carolyn Stare wrote: Pt need a bp fup an appt , please contact to schedule an appt

## 2019-10-13 ENCOUNTER — Ambulatory Visit
Admission: RE | Admit: 2019-10-13 | Discharge: 2019-10-13 | Disposition: A | Discharge status: Home / Self Care | Payer: 59 | Source: Ambulatory Visit | Attending: Advanced Practice Midwife | Admitting: Advanced Practice Midwife

## 2019-10-13 DIAGNOSIS — Z01419 Encounter for gynecological examination (general) (routine) without abnormal findings: Secondary | ICD-10-CM | POA: Diagnosis present

## 2019-10-13 DIAGNOSIS — Z1231 Encounter for screening mammogram for malignant neoplasm of breast: Secondary | ICD-10-CM | POA: Insufficient documentation

## 2019-10-13 DIAGNOSIS — Z1239 Encounter for other screening for malignant neoplasm of breast: Secondary | ICD-10-CM

## 2019-10-13 NOTE — Telephone Encounter (Signed)
Amanda Price, before I call patient. She we have patient come in for a nurse visit to have her BP checked and then schedule her a virtual? I believe I need orders or a note saying it's ok for patient to schedule a BP check with nurse. Let me know what I should do. Thanks.

## 2019-10-14 NOTE — Telephone Encounter (Signed)
Its fine for her to do a BP check with nursing though she also will need lab work and an office visit. Please make sure she is scheduled with Joycelyn Schmid for follow-up. Thanks.

## 2019-10-15 NOTE — Telephone Encounter (Signed)
I called patient & she is scheduled for BP check 12/15.

## 2019-10-21 ENCOUNTER — Other Ambulatory Visit: Payer: Self-pay | Admitting: Orthopedic Surgery

## 2019-10-21 DIAGNOSIS — M21161 Varus deformity, not elsewhere classified, right knee: Secondary | ICD-10-CM

## 2019-10-21 DIAGNOSIS — M1711 Unilateral primary osteoarthritis, right knee: Secondary | ICD-10-CM

## 2019-10-27 ENCOUNTER — Ambulatory Visit: Payer: 59

## 2019-10-29 ENCOUNTER — Other Ambulatory Visit: Payer: Self-pay

## 2019-10-29 ENCOUNTER — Ambulatory Visit
Admission: RE | Admit: 2019-10-29 | Discharge: 2019-10-29 | Disposition: A | Discharge status: Home / Self Care | Payer: 59 | Source: Ambulatory Visit | Attending: Orthopedic Surgery | Admitting: Orthopedic Surgery

## 2019-10-29 ENCOUNTER — Ambulatory Visit: Payer: 59 | Admitting: *Deleted

## 2019-10-29 ENCOUNTER — Ambulatory Visit: Payer: 59

## 2019-10-29 DIAGNOSIS — M1711 Unilateral primary osteoarthritis, right knee: Secondary | ICD-10-CM

## 2019-10-29 DIAGNOSIS — M21161 Varus deformity, not elsewhere classified, right knee: Secondary | ICD-10-CM | POA: Diagnosis present

## 2019-11-09 ENCOUNTER — Ambulatory Visit: Payer: 59 | Admitting: Family

## 2019-12-07 ENCOUNTER — Ambulatory Visit (INDEPENDENT_AMBULATORY_CARE_PROVIDER_SITE_OTHER): Payer: 59 | Admitting: Internal Medicine

## 2019-12-07 ENCOUNTER — Encounter: Payer: Self-pay | Admitting: Internal Medicine

## 2019-12-07 ENCOUNTER — Other Ambulatory Visit: Payer: Self-pay

## 2019-12-07 VITALS — Ht 63.0 in | Wt 195.0 lb

## 2019-12-07 DIAGNOSIS — R5383 Other fatigue: Secondary | ICD-10-CM

## 2019-12-07 DIAGNOSIS — I10 Essential (primary) hypertension: Secondary | ICD-10-CM | POA: Diagnosis not present

## 2019-12-07 DIAGNOSIS — G8929 Other chronic pain: Secondary | ICD-10-CM | POA: Insufficient documentation

## 2019-12-07 DIAGNOSIS — M25561 Pain in right knee: Secondary | ICD-10-CM | POA: Diagnosis not present

## 2019-12-07 DIAGNOSIS — M25562 Pain in left knee: Secondary | ICD-10-CM | POA: Diagnosis not present

## 2019-12-07 MED ORDER — LOSARTAN POTASSIUM-HCTZ 100-25 MG PO TABS: 1.0000 | ORAL_TABLET | Freq: Every day | ORAL | 3 refills | Status: DC

## 2019-12-07 NOTE — Assessment & Plan Note (Signed)
Advised to add tylenol up to 2000 mg daily in divided doses

## 2019-12-07 NOTE — Assessment & Plan Note (Addendum)
No secondary causes identified.  Using NSAIDS intermittently.  I am  making a decision to change patient's ACE Inhibitor to an ARB  based on increased community reports of  angioedema.  I also advised patient to to take it at night before her shift .  I have advised her that she will need to return to the office in 2 weeks for labs and RN visit

## 2019-12-07 NOTE — Progress Notes (Signed)
Virtual Visit via [email or web address]  This visit type was conducted due to national recommendations for restrictions regarding the COVID-19 pandemic (e.g. social distancing).  This format is felt to be most appropriate for this patient at this time.  All issues noted in this document were discussed and addressed.  No physical exam was performed (except for noted visual exam findings with Video Visits).   I connected with@ on 12/07/19 at 11:00 AM EST by a video enabled telemedicine applicationand verified that I am speaking with the correct person using two identifiers. Location patient: home Location provider: work or home office Persons participating in the virtual visit: patient, provider  I discussed the limitations, risks, security and privacy concerns of performing an evaluation and management service by telephone and the availability of in person appointments. I also discussed with the patient that there may be a patient responsible charge related to this service. The patient expressed understanding and agreed to proceed.  Reason for visit: hypertension follow up  HPI:  54 yr old female diagnosed with hypertension several years ago lost to follow up since Nov 2019.  Was tolerating lisinopril/hct without side effects.  Has not taken it in several months due to again being lost to follow up after Nov 2020 restart.   Long discussion with patient about the causes and natural history of hypertension , the chronic nature of the condition,  Goals  of therapy,  The risks and benefits of various medications used to treat it,  And the need for follow up a MINIMUM of every 6 months once goal BP as been reached  .  2) Bilateral knee pain:  Right knee replacement has been postponed due to COVID. USING MOTRIN PRN but doesn't feel it helps.   I provided  25 minutes of non-face-to-face time during this encounter reviewing patient's current problems and past procedures/imaging studies, providing counseling on the  above mentioned problems , and coordination  of care .  ROS: See pertinent positives and negatives per HPI.  Past Medical History:  Diagnosis Date  . Frequent headaches   . H/O breast biopsy    Left Breast  . Hypertension     Past Surgical History:  Procedure Laterality Date  . BREAST BIOPSY Left    needle per pt  . BREAST SURGERY     Breast biopsy  . COLONOSCOPY N/A 09/26/2015   Procedure: COLONOSCOPY;  Surgeon: Lollie Sails, MD;  Location: [facility] ENDOSCOPY;  Service: Endoscopy;  Laterality: N/A;  . INTRAUTERINE DEVICE (IUD) INSERTION  04/2014  . KNEE SURGERY Right     Family History  Problem Relation Age of Onset  . Healthy Mother   . Healthy Father   . Hypertension Sister   . Diabetes Sister   . Hypertension Sister   . Diabetes Sister   . Hypertension Sister   . Diabetes Sister   . Breast cancer Neg Hx     SOCIAL HX: she works night shift as a Patent attorney.  reports that she has never smoked. She has never used smokeless tobacco. She reports current alcohol use. She reports that she does not use drugs.   Current Outpatient Medications:  .  valACYclovir (VALTREX) 500 MG tablet, Take 1 tablet (500 mg total) by mouth daily., Disp: 90 tablet, Rfl: 4 .  XOLEGEL 2 % GEL, APPLY TO THE AFFECTED AREA(S) TWICE DAILY, Disp: , Rfl: 1 .  losartan-hydrochlorothiazide (HYZAAR) 100-25 MG tablet, Take 1 tablet by mouth daily., Disp: 90 tablet, Rfl: 3  EXAM:  VITALS per patient if applicable:  GENERAL: alert, oriented, appears well and in no acute distress  HEENT: atraumatic, conjunttiva clear, no obvious abnormalities on inspection of external nose and ears. Missing the top row of teeth.  NECK: normal movements of the head and neck  LUNGS: on inspection no signs of respiratory distress, breathing rate appears normal, no obvious gross SOB, gasping or wheezing  CV: no obvious cyanosis  MS: moves all visible extremities without noticeable abnormality  PSYCH/NEURO:  pleasant and cooperative, no obvious depression or anxiety, speech and thought processing grossly intact  ASSESSMENT AND PLAN:  Discussed the following assessment and plan:  Essential hypertension - Plan: Comprehensive metabolic panel, Lipid panel, Microalbumin / creatinine urine ratio  Fatigue, unspecified type - Plan: CBC with Differential/Platelet  Bilateral chronic knee pain  Essential hypertension No secondary causes identified.  Using NSAIDS intermittently.  I am  making a decision to change patient's ACE Inhibitor to an ARB  based on increased community reports of  angioedema.  I also advised patient to to take it at night before her shift .  I have advised her that she will need to return to the office in 2 weeks for labs and RN visit    Bilateral chronic knee pain Advised to add tylenol up to 2000 mg daily in divided doses     I discussed the assessment and treatment plan with the patient. The patient was provided an opportunity to ask questions and all were answered. The patient agreed with the plan and demonstrated an understanding of the instructions.   The patient was advised to call back or seek an in-person evaluation if the symptoms worsen or if the condition fails to improve as anticipated.  I provided  30 minutes of non-face-to-face time during this encounter reviewing patient's treatment of hypertension , providing counseling on the above mentioned problems , and coordination  of care .  Sherlene Shams, MD

## 2019-12-16 ENCOUNTER — Other Ambulatory Visit: Payer: Self-pay

## 2019-12-22 ENCOUNTER — Ambulatory Visit: Payer: 59

## 2019-12-23 ENCOUNTER — Other Ambulatory Visit (INDEPENDENT_AMBULATORY_CARE_PROVIDER_SITE_OTHER): Payer: 59

## 2019-12-23 ENCOUNTER — Other Ambulatory Visit: Payer: Self-pay

## 2019-12-23 ENCOUNTER — Ambulatory Visit: Payer: 59 | Admitting: Lab

## 2019-12-23 VITALS — BP 135/89 | HR 99

## 2019-12-23 DIAGNOSIS — R5383 Other fatigue: Secondary | ICD-10-CM

## 2019-12-23 DIAGNOSIS — I10 Essential (primary) hypertension: Secondary | ICD-10-CM | POA: Diagnosis not present

## 2019-12-23 LAB — MICROALBUMIN / CREATININE URINE RATIO
Creatinine,U: 87.5 mg/dL
Microalb Creat Ratio: 0.8 mg/g (ref 0.0–30.0)
Microalb, Ur: <0.7 mg/dL (ref 0.0–1.9)

## 2019-12-23 LAB — COMPREHENSIVE METABOLIC PANEL
ALT: 16 U/L (ref 0–35)
AST: 16 U/L (ref 0–37)
Albumin: 4.1 g/dL (ref 3.5–5.2)
Alkaline Phosphatase: 86 U/L (ref 39–117)
BUN: 17 mg/dL (ref 6–23)
CO2: 31 mEq/L (ref 19–32)
Calcium: 9.5 mg/dL (ref 8.4–10.5)
Chloride: 100 mEq/L (ref 96–112)
Creatinine, Ser: 0.83 mg/dL (ref 0.40–1.20)
GFR: 86.47 mL/min (ref >60.00)
Glucose, Bld: 93 mg/dL (ref 70–99)
Potassium: 3.3 mEq/L — ABNORMAL LOW (ref 3.5–5.1)
Sodium: 136 mEq/L (ref 135–145)
Total Bilirubin: 0.4 mg/dL (ref 0.2–1.2)
Total Protein: 7.2 g/dL (ref 6.0–8.3)

## 2019-12-23 LAB — CBC WITH DIFFERENTIAL/PLATELET
Basophils Absolute: 0 10*3/uL (ref 0.0–0.1)
Basophils Relative: 0.7 % (ref 0.0–3.0)
Eosinophils Absolute: 0.2 10*3/uL (ref 0.0–0.7)
Eosinophils Relative: 2.3 % (ref 0.0–5.0)
HCT: 35.2 % — ABNORMAL LOW (ref 36.0–46.0)
Hemoglobin: 11.5 g/dL — ABNORMAL LOW (ref 12.0–15.0)
Lymphocytes Relative: 40.3 % (ref 12.0–46.0)
Lymphs Abs: 2.7 10*3/uL (ref 0.7–4.0)
MCHC: 32.6 g/dL (ref 30.0–36.0)
MCV: 82.6 fl (ref 78.0–100.0)
Monocytes Absolute: 0.6 10*3/uL (ref 0.1–1.0)
Monocytes Relative: 9.2 % (ref 3.0–12.0)
Neutro Abs: 3.1 10*3/uL (ref 1.4–7.7)
Neutrophils Relative %: 47.5 % (ref 43.0–77.0)
Platelets: 243 10*3/uL (ref 150.0–400.0)
RBC: 4.27 Mil/uL (ref 3.87–5.11)
RDW: 15.4 % (ref 11.5–15.5)
WBC: 6.6 10*3/uL (ref 4.0–10.5)

## 2019-12-23 LAB — LIPID PANEL
Cholesterol: 180 mg/dL (ref 0–200)
HDL: 41.4 mg/dL (ref >39.00)
LDL Cholesterol: 120 mg/dL — ABNORMAL HIGH (ref 0–99)
NonHDL: 138.32
Total CHOL/HDL Ratio: 4
Triglycerides: 92 mg/dL (ref 0.0–149.0)
VLDL: 18.4 mg/dL (ref 0.0–40.0)

## 2019-12-23 NOTE — Progress Notes (Signed)
Pt in office today for BP check. BP reading was 135/89 in Right arm.

## 2020-01-11 ENCOUNTER — Other Ambulatory Visit: Payer: Self-pay | Admitting: Orthopedic Surgery

## 2020-01-20 ENCOUNTER — Encounter
Admission: RE | Admit: 2020-01-20 | Discharge: 2020-01-20 | Disposition: A | Discharge status: Home / Self Care | Payer: 59 | Attending: Orthopedic Surgery | Admitting: Orthopedic Surgery

## 2020-01-20 ENCOUNTER — Encounter: Payer: Self-pay | Admitting: *Deleted

## 2020-01-20 DIAGNOSIS — Z01810 Encounter for preprocedural cardiovascular examination: Secondary | ICD-10-CM | POA: Diagnosis not present

## 2020-01-20 HISTORY — DX: Unspecified osteoarthritis, unspecified site: M19.90

## 2020-01-20 HISTORY — DX: Anemia, unspecified: D64.9

## 2020-01-20 NOTE — Patient Instructions (Signed)
Your procedure is scheduled on: 01-26-20 TUESDAY Report to Same Day Surgery 2nd floor medical mall [facility] Entrance-take elevator on left to 2nd floor.  Check in with surgery information desk.) To find out your arrival time please call [phone] between 1PM - 3PM on 01-25-20 MONDAY  Remember: Instructions that are not followed completely may result in serious medical risk, up to and including death, or upon the discretion of your surgeon and anesthesiologist your surgery may need to be rescheduled.    _x___ 1. Do not eat food after midnight the night before your procedure. NO GUM OR CANDY AFTER MIDNIGHT. You may drink clear liquids up to 2 hours before you are scheduled to arrive at the hospital for your procedure.  Do not drink clear liquids within 2 hours of your scheduled arrival to the hospital.  Clear liquids include  --Water or Apple juice without pulp  -- Gatorade  --Black Coffee or Clear Tea (No milk, no creamers, do not add anything to the coffee or Tea   ____Ensure clear carbohydrate drink on the way to the hospital for bariatric patients  _X___Ensure clear carbohydrate drink-FINISH DRINK 2 HOURS PRIOR TO [place]    __x__ 2. No Alcohol for 24 hours before or after surgery.   __x__3. No Smoking or e-cigarettes for 24 prior to surgery.  Do not use any chewable tobacco products for at least 6 hour prior to surgery   ____  4. Bring all medications with you on the day of surgery if instructed.    __x__ 5. Notify your doctor if there is any change in your medical condition     (cold, fever, infections).    x___6. On the morning of surgery brush your teeth with toothpaste and water.  You may rinse your mouth with mouth wash if you wish.  Do not swallow any toothpaste or mouthwash.   Do not wear jewelry, make-up, hairpins, clips or nail polish.  Do not wear lotions, powders, or perfumes.   Do not shave 48 hours prior to surgery. Men may shave face and  neck.  Do not bring valuables to the hospital.    [facility] is not responsible for any belongings or valuables.               Contacts, dentures or bridgework may not be worn into surgery.  Leave your suitcase in the car. After surgery it may be brought to your room.  For patients admitted to the hospital, discharge time is determined by your treatment team.  _  Patients discharged the day of surgery will not be allowed to drive home.  You will need someone to drive you home and stay with you the night of your procedure.    Please read over the following fact sheets that you were given:   [facility] Preparing for Surgery and MRSA Information/INCENTIVE SPIROMETER INSTRUCTIONS  ____ Take anti-hypertensive listed below, cardiac, seizure, asthma, anti-reflux and psychiatric medicines. These include:  1. NONE  2.  3.  4.  5.  6.  ____Fleets enema or Magnesium Citrate as directed.   _x___ Use CHG Soap or sage wipes as directed on instruction sheet   ____ Use inhalers on the day of surgery and bring to hospital day of surgery  ____ Stop Metformin and Janumet 2 days prior to surgery.    ____ Take 1/2 of usual insulin dose the night before surgery and none on the morning surgery.   ____ Follow recommendations  from Cardiologist, Pulmonologist or PCP regarding stopping Aspirin, Coumadin, Plavix ,Eliquis, Effient, or Pradaxa, and Pletal.  X____Stop Anti-inflammatories such as Advil, Aleve, Ibuprofen, Motrin, Naproxen, Naprosyn, Goodies powders or aspirin products NOW-OK to take Tylenol    ____ Stop supplements until after surgery.   ____ Bring C-Pap to the hospital.

## 2020-01-21 ENCOUNTER — Other Ambulatory Visit: Payer: Self-pay

## 2020-01-21 ENCOUNTER — Encounter
Admission: RE | Admit: 2020-01-21 | Discharge: 2020-01-21 | Disposition: A | Discharge status: Home / Self Care | Payer: 59 | Source: Ambulatory Visit | Attending: Orthopedic Surgery | Admitting: Orthopedic Surgery

## 2020-01-21 DIAGNOSIS — Z01810 Encounter for preprocedural cardiovascular examination: Secondary | ICD-10-CM | POA: Diagnosis not present

## 2020-01-21 LAB — URINALYSIS, ROUTINE W REFLEX MICROSCOPIC
Bilirubin Urine: NEGATIVE
Glucose, UA: NEGATIVE mg/dL
Hgb urine dipstick: NEGATIVE
Ketones, ur: NEGATIVE mg/dL
Nitrite: POSITIVE — AB
Protein, ur: NEGATIVE mg/dL
Specific Gravity, Urine: 1.024 (ref 1.005–1.030)
pH: 5 (ref 5.0–8.0)

## 2020-01-21 LAB — CBC WITH DIFFERENTIAL/PLATELET
Abs Immature Granulocytes: 0.02 10*3/uL (ref 0.00–0.07)
Basophils Absolute: 0 10*3/uL (ref 0.0–0.1)
Basophils Relative: 1 %
Eosinophils Absolute: 0.1 10*3/uL (ref 0.0–0.5)
Eosinophils Relative: 2 %
HCT: 36.6 % (ref 36.0–46.0)
Hemoglobin: 12.3 g/dL (ref 12.0–15.0)
Immature Granulocytes: 0 %
Lymphocytes Relative: 33 %
Lymphs Abs: 2.2 10*3/uL (ref 0.7–4.0)
MCH: 27.1 pg (ref 26.0–34.0)
MCHC: 33.6 g/dL (ref 30.0–36.0)
MCV: 80.6 fL (ref 80.0–100.0)
Monocytes Absolute: 0.6 10*3/uL (ref 0.1–1.0)
Monocytes Relative: 9 %
Neutro Abs: 3.8 10*3/uL (ref 1.7–7.7)
Neutrophils Relative %: 55 %
Platelets: 271 10*3/uL (ref 150–400)
RBC: 4.54 MIL/uL (ref 3.87–5.11)
RDW: 14.8 % (ref 11.5–15.5)
WBC: 6.8 10*3/uL (ref 4.0–10.5)
nRBC: 0 % (ref 0.0–0.2)

## 2020-01-21 LAB — COMPREHENSIVE METABOLIC PANEL
ALT: 12 U/L (ref 0–44)
AST: 16 U/L (ref 15–41)
Albumin: 4.1 g/dL (ref 3.5–5.0)
Alkaline Phosphatase: 73 U/L (ref 38–126)
Anion gap: 9 (ref 5–15)
BUN: 22 mg/dL — ABNORMAL HIGH (ref 6–20)
CO2: 26 mmol/L (ref 22–32)
Calcium: 9.4 mg/dL (ref 8.9–10.3)
Chloride: 100 mmol/L (ref 98–111)
Creatinine, Ser: 0.78 mg/dL (ref 0.44–1.00)
GFR calc Af Amer: >60 mL/min (ref >60)
GFR calc non Af Amer: >60 mL/min (ref >60)
Glucose, Bld: 93 mg/dL (ref 70–99)
Potassium: 3.1 mmol/L — ABNORMAL LOW (ref 3.5–5.1)
Sodium: 135 mmol/L (ref 135–145)
Total Bilirubin: 0.3 mg/dL (ref 0.3–1.2)
Total Protein: 7.4 g/dL (ref 6.5–8.1)

## 2020-01-21 LAB — SURGICAL PCR SCREEN
MRSA, PCR: NEGATIVE
Staphylococcus aureus: POSITIVE — AB

## 2020-01-22 ENCOUNTER — Other Ambulatory Visit
Admission: RE | Admit: 2020-01-22 | Discharge: 2020-01-22 | Disposition: A | Discharge status: Home / Self Care | Payer: 59 | Source: Ambulatory Visit | Attending: Orthopedic Surgery | Admitting: Orthopedic Surgery

## 2020-01-22 DIAGNOSIS — Z01812 Encounter for preprocedural laboratory examination: Secondary | ICD-10-CM | POA: Insufficient documentation

## 2020-01-22 DIAGNOSIS — Z20822 Contact with and (suspected) exposure to covid-19: Secondary | ICD-10-CM | POA: Insufficient documentation

## 2020-01-22 LAB — SARS CORONAVIRUS 2 (TAT 6-24 HRS): SARS Coronavirus 2: NEGATIVE

## 2020-01-26 ENCOUNTER — Inpatient Hospital Stay
Admission: RE | Admit: 2020-01-26 | Discharge: 2020-01-30 | DRG: 470 | Disposition: A | Discharge status: Home Health | Payer: 59 | Attending: Orthopedic Surgery | Admitting: Orthopedic Surgery

## 2020-01-26 ENCOUNTER — Encounter: Payer: Self-pay | Admitting: Orthopedic Surgery

## 2020-01-26 ENCOUNTER — Other Ambulatory Visit: Payer: Self-pay

## 2020-01-26 ENCOUNTER — Inpatient Hospital Stay: Payer: 59 | Admitting: Certified Registered Nurse Anesthetist

## 2020-01-26 ENCOUNTER — Observation Stay: Payer: 59

## 2020-01-26 ENCOUNTER — Encounter
Admission: RE | Disposition: A | Discharge status: Home Health | Payer: Self-pay | Source: Home / Self Care | Attending: Orthopedic Surgery

## 2020-01-26 DIAGNOSIS — M25761 Osteophyte, right knee: Secondary | ICD-10-CM | POA: Diagnosis present

## 2020-01-26 DIAGNOSIS — D62 Acute posthemorrhagic anemia: Secondary | ICD-10-CM | POA: Diagnosis not present

## 2020-01-26 DIAGNOSIS — Z79891 Long term (current) use of opiate analgesic: Secondary | ICD-10-CM | POA: Diagnosis not present

## 2020-01-26 DIAGNOSIS — R000 Tachycardia, unspecified: Secondary | ICD-10-CM | POA: Diagnosis not present

## 2020-01-26 DIAGNOSIS — Z79899 Other long term (current) drug therapy: Secondary | ICD-10-CM

## 2020-01-26 DIAGNOSIS — R9431 Abnormal electrocardiogram [ECG] [EKG]: Secondary | ICD-10-CM | POA: Diagnosis not present

## 2020-01-26 DIAGNOSIS — Z96651 Presence of right artificial knee joint: Secondary | ICD-10-CM

## 2020-01-26 DIAGNOSIS — E876 Hypokalemia: Secondary | ICD-10-CM | POA: Diagnosis not present

## 2020-01-26 DIAGNOSIS — M1711 Unilateral primary osteoarthritis, right knee: Secondary | ICD-10-CM | POA: Diagnosis present

## 2020-01-26 DIAGNOSIS — K5900 Constipation, unspecified: Secondary | ICD-10-CM | POA: Diagnosis not present

## 2020-01-26 DIAGNOSIS — T502X5A Adverse effect of carbonic-anhydrase inhibitors, benzothiadiazides and other diuretics, initial encounter: Secondary | ICD-10-CM | POA: Diagnosis not present

## 2020-01-26 DIAGNOSIS — I10 Essential (primary) hypertension: Secondary | ICD-10-CM | POA: Diagnosis present

## 2020-01-26 DIAGNOSIS — M79602 Pain in left arm: Secondary | ICD-10-CM | POA: Diagnosis not present

## 2020-01-26 DIAGNOSIS — M25519 Pain in unspecified shoulder: Secondary | ICD-10-CM

## 2020-01-26 DIAGNOSIS — G8918 Other acute postprocedural pain: Secondary | ICD-10-CM

## 2020-01-26 HISTORY — PX: TOTAL KNEE ARTHROPLASTY: SHX125

## 2020-01-26 LAB — CBC
HCT: 30.7 % — ABNORMAL LOW (ref 36.0–46.0)
Hemoglobin: 10.1 g/dL — ABNORMAL LOW (ref 12.0–15.0)
MCH: 26.8 pg (ref 26.0–34.0)
MCHC: 32.9 g/dL (ref 30.0–36.0)
MCV: 81.4 fL (ref 80.0–100.0)
Platelets: 194 10*3/uL (ref 150–400)
RBC: 3.77 MIL/uL — ABNORMAL LOW (ref 3.87–5.11)
RDW: 14.6 % (ref 11.5–15.5)
WBC: 15.4 10*3/uL — ABNORMAL HIGH (ref 4.0–10.5)
nRBC: 0 % (ref 0.0–0.2)

## 2020-01-26 LAB — POCT I-STAT, CHEM 8
BUN: 20 mg/dL (ref 6–20)
Calcium, Ion: 1.17 mmol/L (ref 1.15–1.40)
Chloride: 99 mmol/L (ref 98–111)
Creatinine, Ser: 0.7 mg/dL (ref 0.44–1.00)
Glucose, Bld: 83 mg/dL (ref 70–99)
HCT: 36 % (ref 36.0–46.0)
Hemoglobin: 12.2 g/dL (ref 12.0–15.0)
Potassium: 3.2 mmol/L — ABNORMAL LOW (ref 3.5–5.1)
Sodium: 137 mmol/L (ref 135–145)
TCO2: 25 mmol/L (ref 22–32)

## 2020-01-26 LAB — CREATININE, SERUM
Creatinine, Ser: 0.71 mg/dL (ref 0.44–1.00)
GFR calc Af Amer: >60 mL/min (ref >60)
GFR calc non Af Amer: >60 mL/min (ref >60)

## 2020-01-26 LAB — POCT PREGNANCY, URINE: Preg Test, Ur: NEGATIVE

## 2020-01-26 LAB — ABO/RH: ABO/RH(D): O POS

## 2020-01-26 SURGERY — ARTHROPLASTY, KNEE, TOTAL
Anesthesia: Spinal | Site: Knee | Laterality: Right

## 2020-01-26 MED ORDER — MIDAZOLAM HCL 2 MG/2ML IJ SOLN
INTRAMUSCULAR | Status: AC
  Filled 2020-01-26: qty 2

## 2020-01-26 MED ORDER — ACETAMINOPHEN 500 MG PO TABS
1000.0000 mg | ORAL_TABLET | Freq: Four times a day (QID) | ORAL | Status: AC
  Administered 2020-01-26 – 2020-01-27 (×2): 1000 mg via ORAL
  Filled 2020-01-26 (×4): qty 2

## 2020-01-26 MED ORDER — ONDANSETRON HCL 4 MG/2ML IJ SOLN: 4.0000 mg | Freq: Four times a day (QID) | INTRAMUSCULAR | Status: DC | PRN

## 2020-01-26 MED ORDER — PROPOFOL 500 MG/50ML IV EMUL
INTRAVENOUS | Status: DC | PRN
  Administered 2020-01-26: 125 ug/kg/min via INTRAVENOUS

## 2020-01-26 MED ORDER — LOSARTAN POTASSIUM-HCTZ 100-25 MG PO TABS: 1.0000 | ORAL_TABLET | ORAL | Status: DC

## 2020-01-26 MED ORDER — FAMOTIDINE 20 MG PO TABS
ORAL_TABLET | ORAL | Status: AC
  Administered 2020-01-26: 20 mg via ORAL
  Filled 2020-01-26: qty 1

## 2020-01-26 MED ORDER — MAGNESIUM CITRATE PO SOLN
1.0000 | Freq: Once | ORAL | Status: DC | PRN
  Filled 2020-01-26 (×2): qty 296

## 2020-01-26 MED ORDER — BUPIVACAINE-EPINEPHRINE (PF) 0.25% -1:200000 IJ SOLN
INTRAMUSCULAR | Status: AC
  Filled 2020-01-26: qty 30

## 2020-01-26 MED ORDER — TRAMADOL HCL 50 MG PO TABS
50.0000 mg | ORAL_TABLET | Freq: Four times a day (QID) | ORAL | Status: DC
  Administered 2020-01-26 – 2020-01-30 (×16): 50 mg via ORAL
  Filled 2020-01-26 (×16): qty 1

## 2020-01-26 MED ORDER — LACTATED RINGERS IV SOLN: INTRAVENOUS | Status: DC

## 2020-01-26 MED ORDER — HYDROMORPHONE HCL 1 MG/ML IJ SOLN
0.5000 mg | INTRAMUSCULAR | Status: DC | PRN
  Administered 2020-01-26: 15:00:00 1 mg via INTRAVENOUS
  Administered 2020-01-27: 03:00:00 0.5 mg via INTRAVENOUS
  Filled 2020-01-26 (×2): qty 1

## 2020-01-26 MED ORDER — OXYCODONE HCL 5 MG PO TABS
5.0000 mg | ORAL_TABLET | ORAL | Status: DC | PRN
  Administered 2020-01-26 – 2020-01-29 (×3): 10 mg via ORAL
  Administered 2020-01-29: 11:00:00 5 mg via ORAL
  Administered 2020-01-30 (×2): 10 mg via ORAL
  Filled 2020-01-26: qty 2
  Filled 2020-01-26: qty 1
  Filled 2020-01-26 (×2): qty 2
  Filled 2020-01-26 (×2): qty 1
  Filled 2020-01-26 (×3): qty 2

## 2020-01-26 MED ORDER — OXYCODONE HCL 5 MG PO TABS
ORAL_TABLET | ORAL | Status: AC
  Filled 2020-01-26: qty 2

## 2020-01-26 MED ORDER — BISACODYL 10 MG RE SUPP
10.0000 mg | Freq: Every day | RECTAL | Status: DC | PRN
  Administered 2020-01-30: 05:00:00 10 mg via RECTAL
  Filled 2020-01-26: qty 1

## 2020-01-26 MED ORDER — OXYCODONE HCL 5 MG PO TABS
10.0000 mg | ORAL_TABLET | ORAL | Status: DC | PRN
  Administered 2020-01-26 – 2020-01-29 (×8): 10 mg via ORAL
  Filled 2020-01-26 (×6): qty 2
  Filled 2020-01-26: qty 3

## 2020-01-26 MED ORDER — ACETAMINOPHEN 500 MG PO TABS
ORAL_TABLET | ORAL | Status: AC
  Administered 2020-01-26: 13:00:00 1000 mg via ORAL
  Filled 2020-01-26: qty 2

## 2020-01-26 MED ORDER — CEFAZOLIN SODIUM-DEXTROSE 2-4 GM/100ML-% IV SOLN
INTRAVENOUS | Status: AC
  Administered 2020-01-26: 13:00:00 2 g via INTRAVENOUS
  Filled 2020-01-26: qty 100

## 2020-01-26 MED ORDER — CEFAZOLIN SODIUM-DEXTROSE 2-4 GM/100ML-% IV SOLN
INTRAVENOUS | Status: AC
  Filled 2020-01-26: qty 100

## 2020-01-26 MED ORDER — BUPIVACAINE-EPINEPHRINE (PF) 0.25% -1:200000 IJ SOLN
INTRAMUSCULAR | Status: DC | PRN
  Administered 2020-01-26: 30 mL

## 2020-01-26 MED ORDER — FENTANYL CITRATE (PF) 100 MCG/2ML IJ SOLN
INTRAMUSCULAR | Status: AC
  Filled 2020-01-26: qty 2

## 2020-01-26 MED ORDER — MAGNESIUM HYDROXIDE 400 MG/5ML PO SUSP
30.0000 mL | Freq: Every day | ORAL | Status: DC | PRN
  Administered 2020-01-27 – 2020-01-28 (×2): 30 mL via ORAL
  Filled 2020-01-26 (×3): qty 30

## 2020-01-26 MED ORDER — PROPOFOL 10 MG/ML IV BOLUS
INTRAVENOUS | Status: AC
  Filled 2020-01-26: qty 20

## 2020-01-26 MED ORDER — MORPHINE SULFATE (PF) 10 MG/ML IV SOLN
INTRAVENOUS | Status: AC
  Filled 2020-01-26: qty 1

## 2020-01-26 MED ORDER — BUPIVACAINE HCL (PF) 0.5 % IJ SOLN
INTRAMUSCULAR | Status: DC | PRN
  Administered 2020-01-26: 3 mL

## 2020-01-26 MED ORDER — FENTANYL CITRATE (PF) 100 MCG/2ML IJ SOLN: 25.0000 ug | INTRAMUSCULAR | Status: DC | PRN

## 2020-01-26 MED ORDER — SODIUM CHLORIDE 0.9 % IV SOLN: INTRAVENOUS | Status: DC

## 2020-01-26 MED ORDER — PROPOFOL 500 MG/50ML IV EMUL
INTRAVENOUS | Status: AC
  Filled 2020-01-26: qty 50

## 2020-01-26 MED ORDER — TRAMADOL HCL 50 MG PO TABS
ORAL_TABLET | ORAL | Status: AC
  Administered 2020-01-26: 13:00:00 50 mg via ORAL
  Filled 2020-01-26: qty 1

## 2020-01-26 MED ORDER — BUPIVACAINE LIPOSOME 1.3 % IJ SUSP
INTRAMUSCULAR | Status: AC
  Filled 2020-01-26: qty 20

## 2020-01-26 MED ORDER — MORPHINE SULFATE 10 MG/ML IJ SOLN
INTRAMUSCULAR | Status: DC | PRN
  Administered 2020-01-26: 10 mg

## 2020-01-26 MED ORDER — METHOCARBAMOL 500 MG PO TABS
500.0000 mg | ORAL_TABLET | Freq: Four times a day (QID) | ORAL | Status: DC | PRN
  Administered 2020-01-27 – 2020-01-28 (×4): 500 mg via ORAL
  Filled 2020-01-26 (×4): qty 1

## 2020-01-26 MED ORDER — PANTOPRAZOLE SODIUM 40 MG PO TBEC
40.0000 mg | DELAYED_RELEASE_TABLET | Freq: Every day | ORAL | Status: DC
  Administered 2020-01-26 – 2020-01-30 (×5): 40 mg via ORAL
  Filled 2020-01-26 (×6): qty 1

## 2020-01-26 MED ORDER — PHENOL 1.4 % MT LIQD
1.0000 | OROMUCOSAL | Status: DC | PRN
  Filled 2020-01-26: qty 177

## 2020-01-26 MED ORDER — ACETAMINOPHEN 325 MG PO TABS: 325.0000 mg | ORAL_TABLET | Freq: Four times a day (QID) | ORAL | Status: DC | PRN

## 2020-01-26 MED ORDER — ZOLPIDEM TARTRATE 5 MG PO TABS
5.0000 mg | ORAL_TABLET | Freq: Every evening | ORAL | Status: DC | PRN
  Filled 2020-01-26: qty 1

## 2020-01-26 MED ORDER — NEOMYCIN-POLYMYXIN B GU 40-200000 IR SOLN
Status: AC
  Filled 2020-01-26: qty 20

## 2020-01-26 MED ORDER — CHLORHEXIDINE GLUCONATE 4 % EX LIQD: 60.0000 mL | Freq: Once | CUTANEOUS | Status: DC

## 2020-01-26 MED ORDER — NEOMYCIN-POLYMYXIN B GU 40-200000 IR SOLN
Status: DC | PRN
  Administered 2020-01-26: 14 mL

## 2020-01-26 MED ORDER — PROMETHAZINE HCL 25 MG/ML IJ SOLN: 6.2500 mg | INTRAMUSCULAR | Status: DC | PRN

## 2020-01-26 MED ORDER — SODIUM CHLORIDE 0.9 % IV SOLN
INTRAVENOUS | Status: DC | PRN
  Administered 2020-01-26: 60 mL

## 2020-01-26 MED ORDER — CEFAZOLIN SODIUM-DEXTROSE 2-4 GM/100ML-% IV SOLN
2.0000 g | Freq: Four times a day (QID) | INTRAVENOUS | Status: DC
  Administered 2020-01-26: 18:00:00 2 g via INTRAVENOUS
  Filled 2020-01-26 (×2): qty 100

## 2020-01-26 MED ORDER — ALUM & MAG HYDROXIDE-SIMETH 200-200-20 MG/5ML PO SUSP: 30.0000 mL | ORAL | Status: DC | PRN

## 2020-01-26 MED ORDER — DIPHENHYDRAMINE HCL 12.5 MG/5ML PO ELIX
12.5000 mg | ORAL_SOLUTION | ORAL | Status: DC | PRN
  Filled 2020-01-26: qty 10

## 2020-01-26 MED ORDER — METOCLOPRAMIDE HCL 5 MG/ML IJ SOLN: 5.0000 mg | Freq: Three times a day (TID) | INTRAMUSCULAR | Status: DC | PRN

## 2020-01-26 MED ORDER — LOSARTAN POTASSIUM 50 MG PO TABS
100.0000 mg | ORAL_TABLET | ORAL | Status: DC
  Administered 2020-01-27 – 2020-01-30 (×4): 100 mg via ORAL
  Filled 2020-01-26 (×5): qty 2

## 2020-01-26 MED ORDER — PHENYLEPHRINE HCL (PRESSORS) 10 MG/ML IV SOLN
INTRAVENOUS | Status: DC | PRN
  Administered 2020-01-26 (×2): 200 ug via INTRAVENOUS
  Administered 2020-01-26 (×2): 100 ug via INTRAVENOUS

## 2020-01-26 MED ORDER — METOCLOPRAMIDE HCL 10 MG PO TABS: 5.0000 mg | ORAL_TABLET | Freq: Three times a day (TID) | ORAL | Status: DC | PRN

## 2020-01-26 MED ORDER — HYDROCHLOROTHIAZIDE 25 MG PO TABS
25.0000 mg | ORAL_TABLET | ORAL | Status: DC
  Administered 2020-01-27 – 2020-01-30 (×4): 25 mg via ORAL
  Filled 2020-01-26 (×4): qty 1

## 2020-01-26 MED ORDER — METHOCARBAMOL 1000 MG/10ML IJ SOLN
500.0000 mg | Freq: Four times a day (QID) | INTRAVENOUS | Status: DC | PRN
  Filled 2020-01-26: qty 5

## 2020-01-26 MED ORDER — ONDANSETRON HCL 4 MG/2ML IJ SOLN
INTRAMUSCULAR | Status: DC | PRN
  Administered 2020-01-26: 4 mg via INTRAVENOUS

## 2020-01-26 MED ORDER — DOCUSATE SODIUM 100 MG PO CAPS
100.0000 mg | ORAL_CAPSULE | Freq: Two times a day (BID) | ORAL | Status: DC
  Administered 2020-01-26 – 2020-01-30 (×9): 100 mg via ORAL
  Filled 2020-01-26 (×10): qty 1

## 2020-01-26 MED ORDER — ONDANSETRON HCL 4 MG PO TABS
4.0000 mg | ORAL_TABLET | Freq: Four times a day (QID) | ORAL | Status: DC | PRN
  Administered 2020-01-26 – 2020-01-28 (×2): 4 mg via ORAL
  Filled 2020-01-26 (×2): qty 1

## 2020-01-26 MED ORDER — MIDAZOLAM HCL 5 MG/5ML IJ SOLN
INTRAMUSCULAR | Status: DC | PRN
  Administered 2020-01-26: 2 mg via INTRAVENOUS

## 2020-01-26 MED ORDER — FENTANYL CITRATE (PF) 100 MCG/2ML IJ SOLN
INTRAMUSCULAR | Status: DC | PRN
  Administered 2020-01-26 (×4): 25 ug via INTRAVENOUS

## 2020-01-26 MED ORDER — ACETAMINOPHEN 10 MG/ML IV SOLN
INTRAVENOUS | Status: DC | PRN
  Administered 2020-01-26: 1000 mg via INTRAVENOUS

## 2020-01-26 MED ORDER — SODIUM CHLORIDE 0.9 % IV SOLN
INTRAVENOUS | Status: DC | PRN
  Administered 2020-01-26: 60 ug/min via INTRAVENOUS

## 2020-01-26 MED ORDER — MENTHOL 3 MG MT LOZG
1.0000 | LOZENGE | OROMUCOSAL | Status: DC | PRN
  Filled 2020-01-26: qty 9

## 2020-01-26 MED ORDER — ENOXAPARIN SODIUM 40 MG/0.4ML ~~LOC~~ SOLN
30.0000 mg | Freq: Two times a day (BID) | SUBCUTANEOUS | Status: DC
  Administered 2020-01-27 – 2020-01-28 (×3): 30 mg via SUBCUTANEOUS
  Filled 2020-01-26 (×5): qty 0.4

## 2020-01-26 MED ORDER — FAMOTIDINE 20 MG PO TABS: 20.0000 mg | ORAL_TABLET | Freq: Once | ORAL | Status: AC

## 2020-01-26 MED ORDER — CEFAZOLIN SODIUM-DEXTROSE 2-4 GM/100ML-% IV SOLN
2.0000 g | INTRAVENOUS | Status: AC
  Administered 2020-01-26: 2 g via INTRAVENOUS

## 2020-01-26 MED ORDER — SODIUM CHLORIDE FLUSH 0.9 % IV SOLN
INTRAVENOUS | Status: AC
  Filled 2020-01-26: qty 70

## 2020-01-26 SURGICAL SUPPLY — 72 items
BLADE SAGITTAL 25.0X1.19X90 (BLADE) ×2 IMPLANT
BLOCK CUTTING FEMUR 4 RT (MISCELLANEOUS) ×1 IMPLANT
BLOCK CUTTING TIBIAL 3 RT (MISCELLANEOUS) ×1 IMPLANT
BLOCK CUTTING TIBIAL 4 RT MIS (MISCELLANEOUS) ×1 IMPLANT
BNDG ELASTIC 6X5.8 VLCR STR LF (GAUZE/BANDAGES/DRESSINGS) ×1 IMPLANT
CANISTER SUCT 1200ML W/VALVE (MISCELLANEOUS) ×2 IMPLANT
CANISTER SUCT 3000ML PPV (MISCELLANEOUS) ×4 IMPLANT
CANISTER WOUND CARE 500ML ATS (WOUND CARE) ×2 IMPLANT
CEMENT HV SMART SET (Cement) ×4 IMPLANT
CHLORAPREP W/TINT 26 (MISCELLANEOUS) ×4 IMPLANT
COOLER POLAR GLACIER W/PUMP (MISCELLANEOUS) ×2 IMPLANT
COVER WAND RF STERILE (DRAPES) ×2 IMPLANT
CUFF TOURN SGL QUICK 24 (TOURNIQUET CUFF)
CUFF TOURN SGL QUICK 30 (TOURNIQUET CUFF) ×1
CUFF TRNQT CYL 24X4X16.5-23 (TOURNIQUET CUFF) IMPLANT
CUFF TRNQT CYL 30X4X21-28X (TOURNIQUET CUFF) IMPLANT
DRAPE 3/4 80X56 (DRAPES) ×4 IMPLANT
ELECT CAUTERY BLADE 6.4 (BLADE) ×2 IMPLANT
ELECT REM PT RETURN 9FT ADLT (ELECTROSURGICAL) ×2
ELECTRODE REM PT RTRN 9FT ADLT (ELECTROSURGICAL) ×1 IMPLANT
FEMORAL COMP SZ4 RIGHT SPHERE (Femur) ×1 IMPLANT
FEMUR BONE MODEL (MISCELLANEOUS) ×1 IMPLANT
GAUZE SPONGE 4X4 12PLY STRL (GAUZE/BANDAGES/DRESSINGS) ×1 IMPLANT
GAUZE XEROFORM 1X8 LF (GAUZE/BANDAGES/DRESSINGS) ×1 IMPLANT
GLOVE BIOGEL PI IND STRL 9 (GLOVE) ×1 IMPLANT
GLOVE BIOGEL PI INDICATOR 9 (GLOVE) ×1
GLOVE INDICATOR 8.0 STRL GRN (GLOVE) ×2 IMPLANT
GLOVE SURG ORTHO 8.0 STRL STRW (GLOVE) ×2 IMPLANT
GLOVE SURG SYN 9.0  PF PI (GLOVE) ×1
GLOVE SURG SYN 9.0 PF PI (GLOVE) ×1 IMPLANT
GOWN SRG 2XL LVL 4 RGLN SLV (GOWNS) ×1 IMPLANT
GOWN STRL NON-REIN 2XL LVL4 (GOWNS) ×1
GOWN STRL REUS W/ TWL LRG LVL3 (GOWN DISPOSABLE) ×1 IMPLANT
GOWN STRL REUS W/ TWL XL LVL3 (GOWN DISPOSABLE) ×1 IMPLANT
GOWN STRL REUS W/TWL LRG LVL3 (GOWN DISPOSABLE) ×1
GOWN STRL REUS W/TWL XL LVL3 (GOWN DISPOSABLE) ×1
HOLDER FOLEY CATH W/STRAP (MISCELLANEOUS) ×2 IMPLANT
HOOD PEEL AWAY FLYTE STAYCOOL (MISCELLANEOUS) ×5 IMPLANT
INSERT TIBIAL SZ4 RIGHT 10MM (Insert) ×1 IMPLANT
KIT PREVENA INCISION MGT20CM45 (CANNISTER) ×2 IMPLANT
KIT TURNOVER KIT A (KITS) ×2 IMPLANT
NDL SAFETY ECLIPSE 18X1.5 (NEEDLE) ×1 IMPLANT
NDL SPNL 18GX3.5 QUINCKE PK (NEEDLE) ×1 IMPLANT
NDL SPNL 20GX3.5 QUINCKE YW (NEEDLE) ×1 IMPLANT
NEEDLE HYPO 18GX1.5 SHARP (NEEDLE) ×1
NEEDLE SPNL 18GX3.5 QUINCKE PK (NEEDLE) ×2 IMPLANT
NEEDLE SPNL 20GX3.5 QUINCKE YW (NEEDLE) ×2 IMPLANT
NS IRRIG 1000ML POUR BTL (IV SOLUTION) ×2 IMPLANT
PACK TOTAL KNEE (MISCELLANEOUS) ×2 IMPLANT
PAD WRAPON POLAR KNEE (MISCELLANEOUS) ×1 IMPLANT
PATELLA RESURFACING MEDACTA 02 (Bone Implant) ×1 IMPLANT
PENCIL SMOKE EVACUATOR COATED (MISCELLANEOUS) ×2 IMPLANT
PULSAVAC PLUS IRRIG FAN TIP (DISPOSABLE) ×2
SCALPEL PROTECTED #10 DISP (BLADE) ×4 IMPLANT
SOL .9 NS 3000ML IRR  AL (IV SOLUTION) ×1
SOL .9 NS 3000ML IRR UROMATIC (IV SOLUTION) ×1 IMPLANT
STAPLER SKIN PROX 35W (STAPLE) ×2 IMPLANT
STEM EXTENSION 11MMX30MM (Stem) ×1 IMPLANT
SUCTION FRAZIER HANDLE 10FR (MISCELLANEOUS) ×1
SUCTION TUBE FRAZIER 10FR DISP (MISCELLANEOUS) ×1 IMPLANT
SUT DVC 2 QUILL PDO  T11 36X36 (SUTURE) ×1
SUT DVC 2 QUILL PDO T11 36X36 (SUTURE) ×1 IMPLANT
SUT ETHIBOND 2 V 37 (SUTURE) IMPLANT
SUT V-LOC 90 ABS DVC 3-0 CL (SUTURE) ×2 IMPLANT
SYR 20ML LL LF (SYRINGE) ×2 IMPLANT
SYR 50ML LL SCALE MARK (SYRINGE) ×4 IMPLANT
TIP FAN IRRIG PULSAVAC PLUS (DISPOSABLE) ×1 IMPLANT
TOWEL OR 17X26 4PK STRL BLUE (TOWEL DISPOSABLE) ×2 IMPLANT
TOWER CARTRIDGE SMART MIX (DISPOSABLE) ×2 IMPLANT
TRAY FOLEY MTR SLVR 16FR STAT (SET/KITS/TRAYS/PACK) ×2 IMPLANT
TRAY TIBIAL FIXED T314 RIGHT (Miscellaneous) ×1 IMPLANT
WRAPON POLAR PAD KNEE (MISCELLANEOUS) ×2

## 2020-01-26 NOTE — H&P (Signed)
Reviewed paper H+P, will be scanned into chart. No changes noted.  

## 2020-01-26 NOTE — Anesthesia Preprocedure Evaluation (Signed)
Anesthesia Evaluation  Patient identified by MRN, date of birth, ID band Patient awake    Reviewed: Allergy & Precautions, H&P , NPO status , Patient's Chart, lab work & pertinent test results, reviewed documented beta blocker date and time   History of Anesthesia Complications Negative for: history of anesthetic complications  Airway Mallampati: II  TM Distance: >3 FB Neck ROM: full    Dental  (+) Partial Lower, Missing, Dental Advidsory Given, Poor Dentition   Pulmonary neg pulmonary ROS,    Pulmonary exam normal breath sounds clear to auscultation       Cardiovascular Exercise Tolerance: Good hypertension, (-) angina(-) Past MI and (-) Cardiac Stents (-) dysrhythmias (-) Valvular Problems/Murmurs Rhythm:regular Rate:Normal     Neuro/Psych negative neurological ROS  negative psych ROS   GI/Hepatic negative GI ROS, Neg liver ROS,   Endo/Other  negative endocrine ROS  Renal/GU negative Renal ROS  negative genitourinary   Musculoskeletal   Abdominal   Peds  Hematology negative hematology ROS (+)   Anesthesia Other Findings Past Medical History: No date: Anemia No date: Arthritis No date: Frequent headaches No date: H/O breast biopsy     Comment:  Left Breast No date: Hypertension   Reproductive/Obstetrics negative OB ROS                             Anesthesia Physical  Anesthesia Plan  ASA: II  Anesthesia Plan: Spinal   Post-op Pain Management:    Induction:   PONV Risk Score and Plan: 2 and Propofol infusion and TIVA  Airway Management Planned: Natural Airway and Nasal Cannula  Additional Equipment:   Intra-op Plan:   Post-operative Plan:   Informed Consent: I have reviewed the patients History and Physical, chart, labs and discussed the procedure including the risks, benefits and alternatives for the proposed anesthesia with the patient or authorized representative  who has indicated his/her understanding and acceptance.     Dental Advisory Given  Plan Discussed with: CRNA  Anesthesia Plan Comments:         Anesthesia Quick Evaluation

## 2020-01-26 NOTE — Evaluation (Signed)
Physical Therapy Evaluation Patient Details Name: Amanda Price MRN: [id number] DOB: 11/07/1966 Today's Date: 01/26/2020   History of Present Illness  admitted for acute hospitalization s/p R TKR, WBAT (01/26/20)  Clinical Impression  Patient resting with eyes closed upon arrival to session; easily awakens to voice/light touch.  Rates pain 8-9/10 R knee; meds received prior to session.  Patient very tense and guarded with all functional movement; difficulty with R quad activation despite facilitation/cuing from therapist.  Demonstrates R LE strength at least 3-/5 and ROM (7-60 degrees), all limited by pain.  L LE additionally limited by pain (planning for replacement once recovered from R LE TKR).  Currently requiring min assist for bed mobility; sit/stand, basic transfers and gait (5') with RW, min/mod assist +2 for safety.  Demonstrates forward flexed posture with heavy WBing bilat UEs; decreased step height/length bilat; consistent cuing for R foot flat and TKE in loading (tends to maintain in flexion). Mild decrease in alertness with transfer; improves with return to sitting/reclined position (BP 122/84, HR 106); suspect possible orthostasis.  Will formally monitor next session; RN informed/aware of performance. Would benefit from skilled PT to address above deficits and promote optimal return to PLOF.; recommend transition to STR upon discharge from acute hospitalization.     Follow Up Recommendations SNF    Equipment Recommendations  Rolling walker with 5" wheels;3in1 (PT)    Recommendations for Other Services       Precautions / Restrictions Precautions Precautions: Fall Restrictions Weight Bearing Restrictions: Yes RLE Weight Bearing: Weight bearing as tolerated      Mobility  Bed Mobility Overal bed mobility: Needs Assistance Bed Mobility: Supine to Sit     Supine to sit: Min assist        Transfers Overall transfer level: Needs assistance Equipment used: Rolling walker  (2 wheeled) Transfers: Sit to/from Stand Sit to Stand: Min assist;Mod assist         General transfer comment: assist for bilat UE/LE placement with transfers; excessive weight shift to L LE, heavy use of UEs required  Ambulation/Gait Ambulation/Gait assistance: Min assist;Mod assist;+2 safety/equipment Gait Distance (Feet): 5 Feet Assistive device: Rolling walker (2 wheeled)       General Gait Details: forward flexed posture with heavy WBing bilat UEs; decreased step height/length bilat; consistent cuing for R foot flat and TKE in loading (tends to maintain in flexion). Mild decrease in alertness with transfer; improves with return to sitting/reclined position (BP 122/84, HR 106); suspect possible orthostasis.  Stairs            Wheelchair Mobility    Modified Rankin (Stroke Patients Only)       Balance Overall balance assessment: Needs assistance Sitting-balance support: No upper extremity supported;Feet supported Sitting balance-Leahy Scale: Good     Standing balance support: Bilateral upper extremity supported Standing balance-Leahy Scale: Fair                               Pertinent Vitals/Pain Pain Assessment: 0-10 Pain Score: 9  Pain Location: R knee Pain Descriptors / Indicators: Aching;Guarding;Grimacing Pain Intervention(s): Limited activity within patient's tolerance;Monitored during session;Premedicated before session;Repositioned    Home Living Family/patient expects to be discharged to:: Private residence Living Arrangements: Children Available Help at Discharge: Family Type of Home: House Home Access: Stairs to enter Entrance Stairs-Rails: Right;Left;Can reach both Entrance Stairs-Number of Steps: full flight (separated by landing) Home Layout: One level Home Equipment: None Additional Comments: Lives  in second-level apartment, one level once in apartment    Prior Function Level of Independence: Independent          Comments: Indep with ADLs, household and community mobilization upon discharge; working full-time as Horticulturist, commercial.  Denies fall history.     Hand Dominance        Extremity/Trunk Assessment   Upper Extremity Assessment Upper Extremity Assessment: Overall WFL for tasks assessed    Lower Extremity Assessment Lower Extremity Assessment: (R knee grossly 3-/5, quad activation limited by pain; very guarded with poor tolerance for ROM.  L LE grossly WFL (but chronic knee problems, tolerating flexion 90 degrees))       Communication   Communication: No difficulties  Cognition Arousal/Alertness: Awake/alert Behavior During Therapy: WFL for tasks assessed/performed Overall Cognitive Status: Within Functional Limits for tasks assessed                                        General Comments      Exercises Total Joint Exercises Goniometric ROM: R knee: 7-60 degrees, act assist Other Exercises Other Exercises: Supine R LE therex, 1x10, act assist ROM: ankle pumps, quad sets,heel slides, hip abduct/adduct, SLR (eccentric).  Limited quad activation due to pain; very guarded with limited tolerance for ROM   Assessment/Plan    PT Assessment Patient needs continued PT services  PT Problem List Decreased strength;Decreased range of motion;Decreased balance;Decreased activity tolerance;Decreased mobility;Decreased coordination;Decreased cognition;Decreased knowledge of use of DME;Decreased safety awareness;Decreased knowledge of precautions;Decreased skin integrity;Pain       PT Treatment Interventions DME instruction;Gait training;Stair training;Functional mobility training;Therapeutic activities;Therapeutic exercise;Balance training;Patient/family education    PT Goals (Current goals can be found in the Care Plan section)  Acute Rehab PT Goals Patient Stated Goal: to get this going PT Goal Formulation: With patient Time For Goal Achievement: 02/09/20 Potential to  Achieve Goals: Good    Frequency BID   Barriers to discharge        Co-evaluation               AM-PAC PT "6 Clicks" Mobility  Outcome Measure Help needed turning from your back to your side while in a flat bed without using bedrails?: A Little Help needed moving from lying on your back to sitting on the side of a flat bed without using bedrails?: A Little Help needed moving to and from a bed to a chair (including a wheelchair)?: A Lot Help needed standing up from a chair using your arms (e.g., wheelchair or bedside chair)?: A Lot Help needed to walk in hospital room?: A Lot Help needed climbing 3-5 steps with a railing? : A Lot 6 Click Score: 14    End of Session Equipment Utilized During Treatment: Gait belt Activity Tolerance: Patient tolerated treatment well Patient left: in chair;with call bell/phone within reach;with chair alarm set Nurse Communication: Mobility status PT Visit Diagnosis: Muscle weakness (generalized) (M62.81);Difficulty in walking, not elsewhere classified (R26.2);Pain Pain - Right/Left: Right Pain - part of body: Knee    Time: 1275-1700 PT Time Calculation (min) (ACUTE ONLY): 30 min   Charges:   PT Evaluation $PT Eval Moderate Complexity: 1 Mod PT Treatments $Therapeutic Exercise: 8-22 mins        Yue Flanigan H. Owens Shark, PT, DPT, NCS 01/26/20, 4:44 PM [phone]

## 2020-01-26 NOTE — Transfer of Care (Signed)
Immediate Anesthesia Transfer of Care Note  Patient: Amanda Price  Procedure(s) Performed: TOTAL KNEE ARTHROPLASTY (Right Knee)  Patient Location: PACU  Anesthesia Type:Spinal  Level of Consciousness: awake, alert  and oriented  Airway & Oxygen Therapy: Patient Spontanous Breathing and Patient connected to face mask oxygen  Post-op Assessment: Report given to RN and Post -op Vital signs reviewed and stable  Post vital signs: Reviewed and stable  Last Vitals:  Vitals Value Taken Time  BP 105/53 01/26/20 0937  Temp    Pulse 100 01/26/20 0939  Resp 14 01/26/20 0939  SpO2 100 % 01/26/20 0939  Vitals shown include unvalidated device data.  Last Pain:  Vitals:   01/26/20 0636  TempSrc: Tympanic  PainSc: 10-Worst pain ever         Complications: No apparent anesthesia complications

## 2020-01-26 NOTE — TOC Progression Note (Signed)
Transition of Care [facility]) - Progression Note    Patient Details  Name: Amanda Price MRN: [id number] Date of Birth: 1966/08/25  [facility] CM/SW Contact  Barrie Dunker, RN Phone Number: 01/26/2020, 3:18 PM  Clinical Narrative:    Requested the price of Lovenox, will notify the patient once obtained        Expected Discharge Plan and Services                                                 Social Determinants of Health (SDOH) Interventions    Readmission Risk Interventions No flowsheet data found.

## 2020-01-26 NOTE — Anesthesia Procedure Notes (Signed)
Spinal  Start time: 01/26/2020 7:24 AM End time: 01/26/2020 7:29 AM Staffing Performed: resident/CRNA  Preanesthetic Checklist Completed: patient identified, IV checked, site marked, risks and benefits discussed, surgical consent, monitors and equipment checked, pre-op evaluation and timeout performed Spinal Block Patient position: sitting Prep: ChloraPrep Patient monitoring: heart rate, continuous pulse ox and blood pressure Approach: midline Location: L3-4 Injection technique: single-shot Needle Needle type: Pencan  Needle gauge: 24 G Needle length: 10 cm Assessment Sensory level: T10

## 2020-01-26 NOTE — Op Note (Signed)
01/26/2020  9:40 AM  PATIENT:  Amanda Price  54 y.o. female  PRE-OPERATIVE DIAGNOSIS:  Primary osteoarthritis of right knee  POST-OPERATIVE DIAGNOSIS:  Primary osteoarthritis of right knee  PROCEDURE:  Procedure(s): TOTAL KNEE ARTHROPLASTY (Right)  SURGEON: Leitha Schuller, MD  ASSISTANTS: Cranston Neighbor, PA-C  ANESTHESIA:   spinal  EBL:  Total I/O In: 700 [I.V.:700] Out: 100 [Urine:75; Blood:25]  BLOOD ADMINISTERED:none  DRAINS: Incisional wound VAC   LOCAL MEDICATIONS USED:  MARCAINE    and OTHER Exparel and morphine  SPECIMEN:  No Specimen  DISPOSITION OF SPECIMEN:  N/A  COUNTS:  YES  TOURNIQUET:   Total Tourniquet Time Documented: Thigh (Right) - 82 minutes Total: Thigh (Right) - 82 minutes   IMPLANTS: Medacta right 4 femur, 4I3T tibial component with 10 mm insert and short stem and 2 patella all components cemented  DICTATION: .Dragon Dictation  patient was brought to the operating room and spinal anesthesia was obtained. After prepping and draping the right leg in sterile fashion, andafter patient identification and timeout procedures were completed,tourniquet was raised andmidline skin incision was made followed by medial parapatellar arthrotomywith bone-on-bone medial compartment osteoarthritis advanced patellofemoral arthritis and severe lateral compartment arthritis,partial synovectomy was also carried out. The ACL and fat pad were excised off the anterior into the meniscus in the proximal tibia cutting guide from the [facility] was appliedand theproximal tibia cut carried out. The distal femoral cut was carried out in a similar fashion there is a severe flexion contracture and varus deformity and after the initial cuts there was an adequate space for the space gauge and so the proximal tibia and distal femur were all were both recut.  The7femoralcutting guide applied with anterior posterior and chamfer cuts made. Large posterior osteophytes were  removed at this time with the aid of a angled osteotome and curette the posterior horns of the menisci were removed at this point.Injection of the above medication was carried out after the femoral and tibial cuts were carried out. The3tibia/4 insert baseplate trial was placed pinned into position and proximal tibial preparation carried out with drilling hand reaming and the keel punch followed by placement of the80femur and sizing the tibial insert size44millimetergave the best fit with stability and full extension. The distal femoral drill holes were made in the notch cut for the trochlear groove was then carried out with trials were then removed the patella was cut using the patellar cutting guide and it sized to a size2afterdrill holes have been made The knee was irrigated with pulsatile lavage and the bony surfaces dried the tibial component was cemented into place first. Excess cement was removed and the polyethylene insert placed with a torque screw placed with a torque screwdriver tightened. The distal femoral component was placed and the knee was held in extension as the patellar button was clamped into place. After the cement was set,excess cement was removed and the knee was again irrigated thoroughly thoroughly irrigated. The tourniquet was let down and hemostasis checkedwithelectrocautery.The arthrotomy was repaired witha heavy Quill suture, followed by 3-0 V lock subcuticular closure,skin staplesfollowed byincisional wound VAC and Polar Care.Marland Kitchen  PLAN OF CARE: Admit to inpatient   PATIENT DISPOSITION:  PACU - hemodynamically stable.

## 2020-01-27 LAB — BASIC METABOLIC PANEL
Anion gap: 8 (ref 5–15)
BUN: 14 mg/dL (ref 6–20)
CO2: 25 mmol/L (ref 22–32)
Calcium: 7.6 mg/dL — ABNORMAL LOW (ref 8.9–10.3)
Chloride: 107 mmol/L (ref 98–111)
Creatinine, Ser: 0.53 mg/dL (ref 0.44–1.00)
GFR calc Af Amer: >60 mL/min (ref >60)
GFR calc non Af Amer: >60 mL/min (ref >60)
Glucose, Bld: 130 mg/dL — ABNORMAL HIGH (ref 70–99)
Potassium: 3.4 mmol/L — ABNORMAL LOW (ref 3.5–5.1)
Sodium: 140 mmol/L (ref 135–145)

## 2020-01-27 LAB — CBC
HCT: 27.2 % — ABNORMAL LOW (ref 36.0–46.0)
Hemoglobin: 8.7 g/dL — ABNORMAL LOW (ref 12.0–15.0)
MCH: 26.5 pg (ref 26.0–34.0)
MCHC: 32 g/dL (ref 30.0–36.0)
MCV: 82.9 fL (ref 80.0–100.0)
Platelets: 170 10*3/uL (ref 150–400)
RBC: 3.28 MIL/uL — ABNORMAL LOW (ref 3.87–5.11)
RDW: 14.5 % (ref 11.5–15.5)
WBC: 10.3 10*3/uL (ref 4.0–10.5)
nRBC: 0 % (ref 0.0–0.2)

## 2020-01-27 MED ORDER — CEFAZOLIN SODIUM-DEXTROSE 2-4 GM/100ML-% IV SOLN
2.0000 g | Freq: Once | INTRAVENOUS | Status: AC
  Administered 2020-01-27: 01:00:00 2 g via INTRAVENOUS
  Filled 2020-01-27: qty 100

## 2020-01-27 MED ORDER — POTASSIUM CHLORIDE 20 MEQ PO PACK
20.0000 meq | PACK | Freq: Two times a day (BID) | ORAL | Status: AC
  Administered 2020-01-27 (×2): 20 meq via ORAL
  Filled 2020-01-27 (×2): qty 1

## 2020-01-27 MED ORDER — DOCUSATE SODIUM 100 MG PO CAPS: 100.0000 mg | ORAL_CAPSULE | Freq: Two times a day (BID) | ORAL | 0 refills | Status: DC

## 2020-01-27 MED ORDER — ENOXAPARIN SODIUM 40 MG/0.4ML ~~LOC~~ SOLN: 40.0000 mg | SUBCUTANEOUS | 0 refills | Status: DC

## 2020-01-27 MED ORDER — OXYCODONE HCL 5 MG PO TABS: 5.0000 mg | ORAL_TABLET | ORAL | 0 refills | Status: DC | PRN

## 2020-01-27 MED ORDER — METHOCARBAMOL 500 MG PO TABS: 500.0000 mg | ORAL_TABLET | Freq: Four times a day (QID) | ORAL | 0 refills | Status: DC | PRN

## 2020-01-27 MED ORDER — ACETAMINOPHEN 500 MG PO TABS: 1000.0000 mg | ORAL_TABLET | Freq: Four times a day (QID) | ORAL | 0 refills | Status: DC

## 2020-01-27 MED ORDER — FE FUMARATE-B12-VIT C-FA-IFC PO CAPS
1.0000 | ORAL_CAPSULE | Freq: Two times a day (BID) | ORAL | Status: DC
  Administered 2020-01-27 – 2020-01-30 (×7): 1 via ORAL
  Filled 2020-01-27 (×9): qty 1

## 2020-01-27 NOTE — Discharge Summary (Signed)
Physician Discharge Summary  Patient ID: Amanda Price MRN: [id number] DOB/AGE: 55-Sep-1967 55 y.o.  Admit date: 01/26/2020 Discharge date: 01/30/20 Admission Diagnoses:  Status post total knee replacement using cement, right [Z96.651]  Discharge Diagnoses: Patient Active Problem List   Diagnosis Date Noted  . Sinus tachycardia 01/28/2020  . Status post total knee replacement using cement, right 01/26/2020  . Bilateral chronic knee pain 12/07/2019  . HSV (herpes simplex virus) infection 09/16/2018  . Encounter to establish care 09/17/2016  . Essential hypertension 06/16/2015    Past Medical History:  Diagnosis Date  . Anemia   . Arthritis   . Frequent headaches   . H/O breast biopsy    Left Breast  . Hypertension      Transfusion: none   Consultants (if any):   Discharged Condition: Improved  Hospital Course: Amanda Price is an 54 y.o. female who was admitted 01/26/2020 with a diagnosis of right knee osteoarhritis and went to the operating room on 01/26/2020 and underwent the above named procedures.    Surgeries: Procedure(s): TOTAL KNEE ARTHROPLASTY on 01/26/2020 Patient tolerated the surgery well. Taken to PACU where she was stabilized and then transferred to the orthopedic floor.  Started on Lovenox 30 mg q 12 hrs. Foot pumps applied bilaterally at 80 mm. Heels elevated on bed with rolled towels. No evidence of DVT. Negative [place]. Physical therapy started on day #1 for gait training and transfer. OT started day #1 for ADL and assisted devices.  Patient's foley was d/c on day #1. Patient's IV was d/c on day #2.  On postop day 2 patient noted to be tachycardic.  She had some pain going down her left arm.  Internal medicine was consulted.  EKG showed sinus tachycardia.  CTA of the chest was obtained showing no large pulmonary embolism.  Patient with no complaints of chest pain or shortness of breath.    On postop day 3 hemoglobin was down to 7.9, she was transfused 1 unit of  packed red blood cells.  On POD4 the patient was started on Metoprolol 25mg  BID which brought the patients HR down to 94  Implants:  Medacta right 4 femur, 4I3T tibial component with 10 mm insert and short stem and 2 patella all components cemented  She was given perioperative antibiotics:  Anti-infectives (From admission, onward)   Start     Dose/Rate Route Frequency Ordered Stop   01/27/20 0045  ceFAZolin (ANCEF) IVPB 2g/100 mL premix     2 g 200 mL/hr over 30 Minutes Intravenous  Once 01/27/20 0045 01/27/20 0118   01/26/20 1230  ceFAZolin (ANCEF) IVPB 2g/100 mL premix  Status:  Discontinued     2 g 200 mL/hr over 30 Minutes Intravenous Every 6 hours 01/26/20 1228 01/27/20 0045   01/26/20 0620  ceFAZolin (ANCEF) 2-4 GM/100ML-% IVPB    Note to Pharmacy: 01/28/20   : cabinet override      01/26/20 0620 01/26/20 1457   01/26/20 0615  ceFAZolin (ANCEF) IVPB 2g/100 mL premix     2 g 200 mL/hr over 30 Minutes Intravenous On call to O.R. 01/26/20 01/28/20 01/26/20 0733    .  She was given sequential compression devices, early ambulation, and Lovenox for DVT prophylaxis.  She benefited maximally from the hospital stay and there were no complications.    Recent vital signs:  Vitals:   01/30/20 0801 01/30/20 1258  BP: 130/82   Pulse: (!) 124 94  Resp: 16 14  Temp: 98.6 F (37 C)  SpO2: 99%     Recent laboratory studies:  Lab Results  Component Value Date   HGB 8.8 (L) 01/29/2020   HGB 7.9 (L) 01/29/2020   HGB 9.3 (L) 01/28/2020   Lab Results  Component Value Date   WBC 11.8 (H) 01/29/2020   PLT 196 01/29/2020   No results found for: INR Lab Results  Component Value Date   NA 131 (L) 01/30/2020   K 3.7 01/30/2020   CL 94 (L) 01/30/2020   CO2 26 01/30/2020   BUN 21 (H) 01/30/2020   CREATININE 0.70 01/30/2020   GLUCOSE 116 (H) 01/30/2020    Discharge Medications:   Allergies as of 01/30/2020   No Known Allergies     Medication List    TAKE these  medications   acetaminophen 500 MG tablet Commonly known as: TYLENOL Take 2 tablets (1,000 mg total) by mouth every 6 (six) hours.   docusate sodium 100 MG capsule Commonly known as: COLACE Take 1 capsule (100 mg total) by mouth 2 (two) times daily.   enoxaparin 40 MG/0.4ML injection Commonly known as: Lovenox Inject 0.4 mLs (40 mg total) into the skin daily for 14 days.   losartan-hydrochlorothiazide 100-25 MG tablet Commonly known as: HYZAAR Take 1 tablet by mouth daily. What changed: when to take this   methocarbamol 500 MG tablet Commonly known as: ROBAXIN Take 1 tablet (500 mg total) by mouth every 6 (six) hours as needed for muscle spasms.   metoprolol tartrate 25 MG tablet Commonly known as: LOPRESSOR Take 1 tablet (25 mg total) by mouth 2 (two) times daily.   oxyCODONE 5 MG immediate release tablet Commonly known as: Oxy IR/ROXICODONE Take 1-2 tablets (5-10 mg total) by mouth every 4 (four) hours as needed for moderate pain (pain score 4-6).   potassium chloride 10 MEQ tablet Commonly known as: KLOR-CON Take 10 mEq by mouth 2 (two) times daily.   Xolegel 2 % Gel Generic drug: Ketoconazole APPLY TO THE AFFECTED AREA(S) TWICE DAILY            Durable Medical Equipment  (From admission, onward)         Start     Ordered   01/26/20 1229  DME Walker rolling  Once    Question Answer Comment  Walker: With 5 Inch Wheels   Patient needs a walker to treat with the following condition Status post total knee replacement using cement, right      01/26/20 1228   01/26/20 1229  DME 3 n 1  Once     01/26/20 1228   01/26/20 1229  DME Bedside commode  Once    Question:  Patient needs a bedside commode to treat with the following condition  Answer:  Status post total knee replacement using cement, right   01/26/20 1228          Diagnostic Studies: DG Knee 1-2 Views Right  Result Date: 01/26/2020 CLINICAL DATA:  Right knee replacement EXAM: RIGHT KNEE - 1-2 VIEW  COMPARISON:  Preoperative imaging from 09/11/2018 FINDINGS: Post right knee arthroplasty with skin staples in place and gas in soft tissues. No signs of acute fracture. Femoral and tibial components appear well seated. IMPRESSION: Immediate postoperative views of right knee arthroplasty without complicating features. Electronically Signed   By: Donzetta Kohut M.D.   On: 01/26/2020 10:25   CT ANGIO CHEST PE W OR WO CONTRAST  Result Date: 01/28/2020 CLINICAL DATA:  Pt. with PMH of hypertension, right knee osteoarthritis, admitted on  01/26/2020, with complaint of right knee pain, s/p right total knee arthroplasty done on 01/26/2020 by Dr. Nunzio Cory orthopedic surgeon. Since 12/30/2019 patient found to have sinus tachycardia and had some pain in the left arm while walking with walker. Hospitalist consulted for further management of sinus tachycardia EXAM: CT ANGIOGRAPHY CHEST WITH CONTRAST TECHNIQUE: Multidetector CT imaging of the chest was performed using the standard protocol during bolus administration of intravenous contrast. Multiplanar CT image reconstructions and MIPs were obtained to evaluate the vascular anatomy. CONTRAST:  28mL OMNIPAQUE IOHEXOL 350 MG/ML SOLN, 47mL OMNIPAQUE IOHEXOL 350 MG/ML SOLN COMPARISON:  None. FINDINGS: Cardiovascular: Patient was injected and image twice in an attempt to optimally opacify the pulmonary arteries. The pulmonary arteries are suboptimally opacified on both acquisitions, essentially making the exam nondiagnostic for pulmonary emboli. There is no evidence of a large central pulmonary embolus. Heart is normal in size and configuration. No coronary artery calcifications or pericardial effusion. Great vessels normal caliber. No aortic dissection or atherosclerosis. Mediastinum/Nodes: No enlarged mediastinal, hilar, or axillary lymph nodes. Thyroid gland, trachea, and esophagus demonstrate no significant findings. Lungs/Pleura: Calcified granuloma, left upper lobe, image 28,  series 8. Lungs otherwise clear. No pleural effusion or pneumothorax. Upper Abdomen: No acute or significant abnormalities. Musculoskeletal: No fracture or acute finding. No osteoblastic or osteolytic lesions. Review of the MIP images confirms the above findings. IMPRESSION: 1. Nondiagnostic exam for the assessment for pulmonary emboli. Despite repeating the exam, the pulmonary arteries were poorly opacified. Allowing for the limitation, there is no evidence of a large central pulmonary embolus. Consider either repeating the exam in 12-24 hours or obtaining a nuclear medicine perfusion study to assess for pulmonary emboli. 2. No acute findings. Lungs are clear other than a single calcified granuloma. Normal heart and great vessels. Electronically Signed   By: Amie Portland M.D.   On: 01/28/2020 19:23   DG Shoulder Left  Result Date: 01/28/2020 CLINICAL DATA:  Left shoulder pain. EXAM: LEFT SHOULDER - 2+ VIEW COMPARISON:  None. FINDINGS: There is no evidence of fracture or dislocation. Mild acromioclavicular spurring with inferiorly directed osteophytes. Mild inferior glenohumeral spurring. No erosion or periosteal reaction. No evidence of focal bone lesion. Soft tissues are unremarkable. IMPRESSION: Mild acromioclavicular and glenohumeral osteoarthritis. No acute abnormality. Electronically Signed   By: Narda Rutherford M.D.   On: 01/28/2020 16:06   ECHOCARDIOGRAM COMPLETE  Result Date: 01/29/2020    ECHOCARDIOGRAM REPORT   Patient Name:   Amanda Price Date of Exam: 01/29/2020 Medical Rec #:  [id number]   Height:       63.0 in Accession #:    [id number]  Weight:       186.7 lb Date of Birth:  16-Aug-1966    BSA:          1.878 m Patient Age:    53 years    BP:           119/80 mmHg Patient Gender: F           HR:           126 bpm. Exam Location:  [facility] Procedure: 2D Echo, Cardiac Doppler and Color Doppler Indications:     Abnormal ECG 794.31  History:         Patient has no prior history of Echocardiogram  examinations.                  Risk Factors:Hypertension.  Sonographer:     Cristela Blue RDCS (AE) Referring Phys:  [phone]  DILEEP ZOXWRKUMAR Diagnosing Phys: Yvonne Kendallhristopher End MD IMPRESSIONS  1. Left ventricular ejection fraction, by estimation, is 65 to 70%. The left ventricle has normal function. Left ventricular endocardial border not optimally defined to evaluate regional wall motion. There is mild left ventricular hypertrophy. Indeterminate diastolic filling due to E-A fusion.  2. Right ventricular systolic function is normal. The right ventricular size is normal. Tricuspid regurgitation signal is inadequate for assessing PA pressure.  3. The mitral valve is grossly normal. Trivial mitral valve regurgitation. No evidence of mitral stenosis.  4. The aortic valve was not well visualized. Aortic valve regurgitation is not visualized. No aortic stenosis is present.  5. The inferior vena cava is normal in size with greater than 50% respiratory variability, suggesting right atrial pressure of 3 mmHg. FINDINGS  Left Ventricle: Left ventricular ejection fraction, by estimation, is 65 to 70%. The left ventricle has normal function. Left ventricular endocardial border not optimally defined to evaluate regional wall motion. The left ventricular internal cavity size was normal in size. There is mild left ventricular hypertrophy. Indeterminate diastolic filling due to E-A fusion. Right Ventricle: The right ventricular size is normal. No increase in right ventricular wall thickness. Right ventricular systolic function is normal. Tricuspid regurgitation signal is inadequate for assessing PA pressure. Left Atrium: Left atrial size was normal in size. Right Atrium: Right atrial size was normal in size. Pericardium: The pericardium was not well visualized. Mitral Valve: The mitral valve is grossly normal. Trivial mitral valve regurgitation. No evidence of mitral valve stenosis. Tricuspid Valve: The tricuspid valve is not well  visualized. Tricuspid valve regurgitation is trivial. Aortic Valve: The aortic valve was not well visualized. Aortic valve regurgitation is not visualized. No aortic stenosis is present. Aortic valve mean gradient measures 5.0 mmHg. Aortic valve peak gradient measures 8.4 mmHg. Aortic valve area, by VTI measures 3.59 cm. Pulmonic Valve: The pulmonic valve was not well visualized. Pulmonic valve regurgitation is not visualized. No evidence of pulmonic stenosis. Aorta: The aortic root is normal in size and structure. Pulmonary Artery: The pulmonary artery is not well seen. Venous: The inferior vena cava is normal in size with greater than 50% respiratory variability, suggesting right atrial pressure of 3 mmHg. IAS/Shunts: The interatrial septum was not well visualized.  LEFT VENTRICLE PLAX 2D LVIDd:         3.22 cm  Diastology LVIDs:         2.07 cm  LV e' lateral:   12.30 cm/s LV PW:         1.07 cm  LV E/e' lateral: 6.1 LV IVS:        0.77 cm  LV e' medial:    6.53 cm/s LVOT diam:     2.00 cm  LV E/e' medial:  11.5 LV SV:         89 LV SV Index:   47 LVOT Area:     3.14 cm  RIGHT VENTRICLE RV Basal diam:  2.64 cm RV S prime:     17.50 cm/s TAPSE (M-mode): 3.3 cm LEFT ATRIUM             Index       RIGHT ATRIUM           Index LA diam:        2.70 cm 1.44 cm/m  RA Area:     11.52 cm 6.14 cm/m LA Vol (A2C):   28.0 ml 14.91 ml/m RA Volume:   24.40 ml  12.99 ml/m LA  Vol (A4C):   25.2 ml 13.42 ml/m LA Biplane Vol: 27.0 ml 14.38 ml/m  AORTIC VALVE                    PULMONIC VALVE AV Area (Vmax):    2.66 cm     PV Vmax:        1.01 m/s AV Area (Vmean):   2.42 cm     PV Peak grad:   4.1 mmHg AV Area (VTI):     3.59 cm     RVOT Peak grad: 4 mmHg AV Vmax:           145.00 cm/s AV Vmean:          103.300 cm/s AV VTI:            0.247 m AV Peak Grad:      8.4 mmHg AV Mean Grad:      5.0 mmHg LVOT Vmax:         123.00 cm/s LVOT Vmean:        79.500 cm/s LVOT VTI:          0.282 m LVOT/AV VTI ratio: 1.14  AORTA Ao  Root diam: 2.90 cm MITRAL VALVE MV Area (PHT): 5.54 cm     SHUNTS MV Decel Time: 137 msec     Systemic VTI:  0.28 m MV E velocity: 75.20 cm/s   Systemic Diam: 2.00 cm MV A velocity: 108.00 cm/s MV E/A ratio:  0.70 Harrell Gave End MD Electronically signed by Nelva Bush MD Signature Date/Time: 01/29/2020/3:39:26 PM    Final    Disposition: Plan for discharge home today on Metoprolol.  Contact Primary care physician to schedule follow-up this week for evaluation of elevated heart rate.  Follow-up Information    Duanne Guess, PA-C Follow up in 2 week(s).   Specialties: Orthopedic Surgery, Emergency Medicine Contact information: [place] [address] [phone]          Signed: Judson Roch PA-C 01/30/2020, 2:01 PM

## 2020-01-27 NOTE — TOC Benefit Eligibility Note (Signed)
Transition of Care [facility]) Benefit Eligibility Note    Patient Details  Name: Amanda Price MRN: [id number] Date of Birth: 02-28-1966   Medication/Dose: Enoxaparin 40mg  x14 days  Covered?: Yes     Prescription Coverage Preferred Pharmacy: [facility] is preferred, but she can use any pharmacy for 1st  fill of medication  Spoke with Person/Company/Phone Number:: [employer]  Co-Pay: no co-pay ($0) at retail pharmacy  Prior Approval: No     Additional Notes: name brand not covered    [phone] Phone Number: 01/27/2020, 11:18 AM

## 2020-01-27 NOTE — Anesthesia Postprocedure Evaluation (Signed)
Anesthesia Post Note  Patient: Amanda Price  Procedure(s) Performed: TOTAL KNEE ARTHROPLASTY (Right Knee)  Patient location during evaluation: Nursing Unit Anesthesia Type: Spinal Level of consciousness: awake, oriented and awake and alert Pain management: pain level controlled Vital Signs Assessment: post-procedure vital signs reviewed and stable Respiratory status: spontaneous breathing, nonlabored ventilation and respiratory function stable Cardiovascular status: blood pressure returned to baseline and stable Postop Assessment: no headache Anesthetic complications: no     Last Vitals:  Vitals:   01/26/20 2345 01/27/20 0404  BP: (!) 132/93 (!) 141/95  Pulse: 100 96  Resp: 17 16  Temp: 36.5 C 36.7 C  SpO2: 100% 100%    Last Pain:  Vitals:   01/27/20 0758  TempSrc:   PainSc: 9                  [employer]

## 2020-01-27 NOTE — Plan of Care (Signed)
  Problem: Clinical Measurements: Goal: Respiratory complications will improve Outcome: Progressing   Problem: Coping: Goal: Level of anxiety will decrease Outcome: Progressing Note: No s/s or reports of anxiety from pt this shift.   Problem: Safety: Goal: Ability to remain free from injury will improve Outcome: Progressing

## 2020-01-27 NOTE — TOC Initial Note (Signed)
Transition of Care [facility]) - Initial/Assessment Note    Patient Details  Name: Amanda Price MRN: [id number] Date of Birth: July 17, 1966  [facility]) CM/SW Contact:    Su Hilt, RN Phone Number: 01/27/2020, 3:02 PM  Clinical Narrative:                 Met with the patient at the bedside to discuss DC plan and needs She lives with her son and he provides transportation, she will need a RW and a 3 in 1, I notified Brad with [employer], She is set up with [facility], The Lovenox is $0 copay, she can afford her medications and is up to date with her PCP, no additional Needs  Expected Discharge Plan: [place] Barriers to Discharge: Continued Medical Work up   Patient Goals and CMS Choice Patient states their goals for this hospitalization and ongoing recovery are:: go home      Expected Discharge Plan and Services Expected Discharge Plan: [place]   Discharge Planning Services: CM Consult Post Acute Care Choice: [place] arrangements for the past 2 months: [place]                 DME Arranged: 3-N-1, Walker rolling DME Agency: [employer] Date DME Agency Contacted: 01/27/20 Time DME Agency Contacted: 1501 Representative spoke with at DME Agency: [place]: PT [place]: [facility]formerly [employer]) Date [place]: 01/27/20 Time [place]: 1501 Representative spoke with at [place]: Helene Kelp  Prior Living Arrangements/Services Living arrangements for the past 2 months: [place] Lives with:: Adult Children Patient language and need for interpreter reviewed:: Yes Do you feel safe going back to the place where you live?: Yes      Need for Family Participation in Patient Care: No (Comment) Care giver support system in place?: Yes (comment)   Criminal Activity/Legal Involvement Pertinent to Current Situation/Hospitalization: No - Comment  as needed  Activities of Daily Living Home Assistive Devices/Equipment: Eyeglasses, Dentures (specify type) ADL Screening (condition at time of admission) Patient's cognitive ability adequate to safely complete daily activities?: Yes Is the patient deaf or have difficulty hearing?: No Does the patient have difficulty seeing, even when wearing glasses/contacts?: No Does the patient have difficulty concentrating, remembering, or making decisions?: No Patient able to express need for assistance with ADLs?: Yes Does the patient have difficulty dressing or bathing?: No Independently performs ADLs?: Yes (appropriate for developmental age) Does the patient have difficulty walking or climbing stairs?: Yes Weakness of Legs: None Weakness of Arms/Hands: None  Permission Sought/Granted   Permission granted to share information with : Yes, Verbal Permission Granted              Emotional Assessment Appearance:: Appears stated age Attitude/Demeanor/Rapport: Engaged Affect (typically observed): Appropriate Orientation: : Oriented to  Time, Oriented to Situation, Oriented to Place, Oriented to Self Alcohol / Substance Use: Not Applicable Psych Involvement: No (comment)  Admission diagnosis:  Status post total knee replacement using cement, right [Z96.651] Patient Active Problem List   Diagnosis Date Noted  . Status post total knee replacement using cement, right 01/26/2020  . Bilateral chronic knee pain 12/07/2019  . HSV (herpes simplex virus) infection 09/16/2018  . Encounter to establish care 09/17/2016  . Essential hypertension 06/16/2015   PCP:  [place]:   [place], [place] -  [address] [place] [address] Phone: [phone] Fax: [phone]  [facility] [place], [place] - [place] [facility] [address] [place] [facility] [place] [address] Phone: [phone]  Fax: [phone]     Social Determinants of Health (SDOH) Interventions    Readmission Risk Interventions No flowsheet data found.

## 2020-01-27 NOTE — Progress Notes (Signed)
Patient has noted edema BLE, however edema greater to RLE which is post op day #1 RTK. MD/PA is aware. Patient currently has TED hoses in place will continue to monitor.

## 2020-01-27 NOTE — Progress Notes (Signed)
Physical Therapy Treatment Patient Details Name: Amanda Price MRN: [id number] DOB: 12-14-1965 Today's Date: 01/27/2020    History of Present Illness admitted for acute hospitalization s/p R TKR, WBAT (01/26/20)    PT Comments    Pt was sitting in the recliner upon entering the room. Educated her on HEP per handout with good understanding of exercises. Pt had severe pain (8/10) during these exercises. Ambulated in the hallway ~200 feet with frequent rest breaks in the hallway. Pt required CGA during amb. Ascended and descended stairs twice in the therapy gym and with CGA needed for safey and sequencing. Pts biggest deficit are with ROM  and pain. She demonstrates improved functional mobility from this morning. AROM 3-53 degrees. Pt will continue to follow per POC. Pt resting comfortably in recliner with nurse tech in room.   Follow Up Recommendations  Home health PT     Equipment Recommendations  Rolling walker with 5" wheels;3in1 (PT)    Recommendations for Other Services       Precautions / Restrictions Precautions Precautions: Fall;Knee Restrictions Weight Bearing Restrictions: No RLE Weight Bearing: Weight bearing as tolerated    Mobility  Bed Mobility               General bed mobility comments: Pt in recliner pre and post session  Transfers Overall transfer level: Needs assistance Equipment used: Rolling walker (2 wheeled) Transfers: Sit to/from Stand Sit to Stand: Min assist         General transfer comment: Sit to stand from recliner required MOD A secondary to lethargy and pain.  Required education on body mechanics and sequencing for safety.  Ambulation/Gait Ambulation/Gait assistance: Supervision;Min guard Gait Distance (Feet): 200 Feet Assistive device: Rolling walker (2 wheeled) Gait Pattern/deviations: Step-to pattern   Gait velocity interpretation: <1.31 ft/sec, indicative of household ambulator General Gait Details: forward flexed posture with  heavy WBing bilat UEs; decreased step height/length bilat; consistent cuing for R foot flat and TKE in loading (tends to maintain in flexion). Mild decrease in alertness with transfer; improves with return to sitting/reclined position (BP 122/84, HR 106); suspect possible orthostasis.   Stairs Stairs: (ascended and descended therapy gym stiars twice)           Wheelchair Mobility    Modified Rankin (Stroke Patients Only)       Balance Overall balance assessment: Needs assistance Sitting-balance support: No upper extremity supported;Feet supported Sitting balance-Leahy Scale: Good Sitting balance - Comments: (good sitting balance with B UE support)   Standing balance support: Bilateral upper extremity supported   Standing balance comment: Good standing balace with B UE support on RW                            Cognition Arousal/Alertness: Lethargic Behavior During Therapy: WFL for tasks assessed/performed Overall Cognitive Status: Within Functional Limits for tasks assessed                                 General Comments: Pt A&o x4      Exercises Total Joint Exercises Goniometric ROM: 53 FL General Exercises - Lower Extremity Ankle Circles/Pumps: Both;15 reps Quad Sets: Right;5 reps Short Arc Quad: Other (comment)(unable due to pain) Heel Slides: 5 reps;Right Hip ABduction/ADduction: 5 reps;AAROM;Right Straight Leg Raises: 5 reps;Right;AAROM Other Exercises Other Exercises: Provided education on goals and role of OT in acute care Other Exercises: Provided education  on use of AE/compensatory techniques to safely perform ADLs while protecting/maintaining knee precautions    General Comments        Pertinent Vitals/Pain Pain Assessment: 0-10 Pain Score: 8  Faces Pain Scale: Hurts even more Pain Location: Surgical knee Pain Descriptors / Indicators: Aching;Discomfort;Grimacing Pain Intervention(s): Ice applied    Home Living  Family/patient expects to be discharged to:: Private residence Living Arrangements: Children Available Help at Discharge: Family Type of Home: House Home Access: Stairs to enter Entrance Stairs-Rails: Right;Left;Can reach both Home Layout: One level Home Equipment: None Additional Comments: Lives in second-level apartment, one level once in apartment    Prior Function Level of Independence: Independent      Comments: Indep with ADLs, household and community mobilization upon discharge; working full-time as Horticulturist, commercial.   PT Goals (current goals can now be found in the care plan section) Acute Rehab PT Goals Patient Stated Goal: "feel better again" PT Goal Formulation: With patient Time For Goal Achievement: 02/09/20 Potential to Achieve Goals: Good Progress towards PT goals: Progressing toward goals    Frequency    BID      PT Plan Current plan remains appropriate    Co-evaluation              AM-PAC PT "6 Clicks" Mobility   Outcome Measure  Help needed turning from your back to your side while in a flat bed without using bedrails?: A Little Help needed moving from lying on your back to sitting on the side of a flat bed without using bedrails?: A Little Help needed moving to and from a bed to a chair (including a wheelchair)?: A Lot Help needed standing up from a chair using your arms (e.g., wheelchair or bedside chair)?: A Lot Help needed to walk in hospital room?: A Lot Help needed climbing 3-5 steps with a railing? : A Lot 6 Click Score: 14    End of Session Equipment Utilized During Treatment: Gait belt Activity Tolerance: Patient tolerated treatment well Patient left: in chair;with call bell/phone within reach;with chair alarm set Nurse Communication: Mobility status PT Visit Diagnosis: Muscle weakness (generalized) (M62.81);Difficulty in walking, not elsewhere classified (R26.2);Pain Pain - Right/Left: Right Pain - part of body: Knee     Time:  1315-1407 PT Time Calculation (min) (ACUTE ONLY): 52 min  Charges:  $Gait Training: 23-37 mins $Therapeutic Exercise: 8-22 mins $Therapeutic Activity: 8-22 mins                    Julaine Fusi PTA 01/27/20, 2:50 PM

## 2020-01-27 NOTE — Progress Notes (Signed)
   Subjective: 1 Day Post-Op Procedure(s) (LRB): TOTAL KNEE ARTHROPLASTY (Right) Patient reports pain as 9 on 0-10 scale.   Patient is well, and has had no acute complaints or problems Denies any CP, SOB, ABD pain. We will continue therapy today.  Plan is to go Home after hospital stay.  Objective: Vital signs in last 24 hours: Temp:  [97.4 F (36.3 C)-98.1 F (36.7 C)] 98.1 F (36.7 C) (03/17 0404) Pulse Rate:  [89-109] 96 (03/17 0404) Resp:  [11-20] 16 (03/17 0404) BP: (92-143)/(53-98) 141/95 (03/17 0404) SpO2:  [96 %-100 %] 100 % (03/17 0404)  Intake/Output from previous day: 03/16 0701 - 03/17 0700 In: 2042.9 [P.O.:50; I.V.:1692.9; IV Piggyback:300] Out: 800 [Urine:775; Blood:25] Intake/Output this shift: No intake/output data recorded.  Recent Labs    01/26/20 0708 01/26/20 1436 01/27/20 0541  HGB 12.2 10.1* 8.7*   Recent Labs    01/26/20 1436 01/27/20 0541  WBC 15.4* 10.3  RBC 3.77* 3.28*  HCT 30.7* 27.2*  PLT 194 170   Recent Labs    01/26/20 0708 01/26/20 0708 01/26/20 1436 01/27/20 0541  NA 137  --   --  140  K 3.2*  --   --  3.4*  CL 99  --   --  107  CO2  --   --   --  25  BUN 20  --   --  14  CREATININE 0.70   < > 0.71 0.53  GLUCOSE 83  --   --  130*  CALCIUM  --   --   --  7.6*   < > = values in this interval not displayed.   No results for input(s): LABPT, INR in the last 72 hours.  EXAM General - Patient is Alert, Appropriate and Oriented Extremity - Neurovascular intact Sensation intact distally Intact pulses distally Dorsiflexion/Plantar flexion intact No cellulitis present Compartment soft Dressing - dressing C/D/I and no drainage, Praveena intact without drainage Motor Function - intact, moving foot and toes well on exam.   Past Medical History:  Diagnosis Date  . Anemia   . Arthritis   . Frequent headaches   . H/O breast biopsy    Left Breast  . Hypertension     Assessment/Plan:   1 Day Post-Op Procedure(s)  (LRB): TOTAL KNEE ARTHROPLASTY (Right) Active Problems:   Status post total knee replacement using cement, right  Estimated body mass index is 33.08 kg/m as calculated from the following:   Height as of this encounter: 5\' 3"  (1.6 m).   Weight as of this encounter: 84.7 kg. Advance diet Up with therapy  Vital signs are stable Pain moderate to severe, will trend continue with scheduled pain medications. Acute post op blood loss anemia -hemoglobin 8.7.  Start iron supplement.  Recheck hemoglobin in the morning Hypokalemia -potassium 3.4.  Will give oral potassium today and recheck K in the morning Care manager to assist with discharge.   DVT Prophylaxis - Lovenox, Foot Pumps and TED hose Weight-Bearing as tolerated to right leg   T. , PA-C [facility] 01/27/2020, 8:14 AM

## 2020-01-27 NOTE — Evaluation (Signed)
Occupational Therapy Evaluation Patient Details Name: Larosa Rhines MRN: [id number] DOB: 1966-09-10 Today's Date: 01/27/2020    History of Present Illness admitted for acute hospitalization s/p R TKR, WBAT (01/26/20)   Clinical Impression   Patient seen this AM for OT evaluation.  Upon entry, patient seated in recliner with eyes closed but awakened with verbalization.  Patient previously I with all ADLs and works full time as a Lobbyist.  Patient states her pain is "doing better" but unable to rate on scale.  Noted grimacing, guarding and pain with movement of post operative extremity.  Patient presents with lethargy and keeps eyes closed for majority of session, stating "I hear what you're saying, it's just these pain medicines".  Provided education on goals and role of OT in acute care setting.  Provided education and handout regarding use of compensatory techniques and adaptive equipment.  Patient verbalized understanding.  Patient would benefit from further occupational therapy treatment targeting use of AE, compensatory techniques, safety awareness, EC techniques and functional transfers. Limited retention secondary to lethargy this date.  Requiring MOD A for sit<>stand transfer due to lethargy and pain.  Potentially interested in 3-in-1 BSC.  Based on today's performance, recommending HH OT.    Follow Up Recommendations  Home health OT    Equipment Recommendations  3 in 1 bedside commode    Recommendations for Other Services       Precautions / Restrictions Precautions Precautions: Fall;Knee Restrictions Weight Bearing Restrictions: No RLE Weight Bearing: Weight bearing as tolerated      Mobility Bed Mobility      General bed mobility comments: Patient in recliner upon entry  Transfers Overall transfer level: Needs assistance Equipment used: Rolling walker (2 wheeled) Transfers: Sit to/from Stand Sit to Stand: Mod assist         General transfer comment: Sit to  stand from recliner required MOD A secondary to lethargy and pain.  Required education on body mechanics and sequencing for safety.    Balance                              ADL either performed or assessed with clinical judgement   ADL Overall ADL's : Needs assistance/impaired                     Lower Body Dressing: Maximal assistance;Sit to/from stand   Toilet Transfer: Moderate assistance;RW;Stand-pivot Toilet Transfer Details (indicate cue type and reason): extra time due to pain         Functional mobility during ADLs: Minimal assistance;Moderate assistance;Rolling walker(Per PT) General ADL Comments: Patient extremely lethargic this date, stating she thinks it's the "pain pills kicking in".  Limited participation.     Vision Patient Visual Report: No change from baseline       Perception     Praxis      Pertinent Vitals/Pain Pain Assessment: Faces(Patient states pain is starting to feel better, but unable to rate.) Pain Score:  Faces Pain Scale: Hurts even more Pain Location: Surgical knee Pain Descriptors / Indicators: Aching;Discomfort;Grimacing Pain Intervention(s): Limited activity within patient's tolerance;Monitored during session     Hand Dominance Right   Extremity/Trunk Assessment Upper Extremity Assessment Upper Extremity Assessment: Overall WFL for tasks assessed   Lower Extremity Assessment Lower Extremity Assessment: Defer to PT evaluation       Communication Communication Communication: No difficulties   Cognition Arousal/Alertness: Lethargic(Patient with difficulty keeping eyes open during  session but states "I hear everything you're saying") Behavior During Therapy: WFL for tasks assessed/performed Overall Cognitive Status: Within Functional Limits for tasks assessed                                 General Comments: Pt A&o x4   General Comments       Exercises Other Exercises Other Exercises:  Provided education on goals and role of OT in acute care Other Exercises: Provided education on use of AE/compensatory techniques to safely perform ADLs while protecting/maintaining knee precautions   Shoulder Instructions      Home Living Family/patient expects to be discharged to:: Private residence Living Arrangements: Children Available Help at Discharge: Family Type of Home: House Home Access: Stairs to enter [employer] of Steps: full flight (separated by landing) Entrance Stairs-Rails: Right;Left;Can reach both Home Layout: One level               Home Equipment: None   Additional Comments: Lives in second-level apartment, one level once in apartment      Prior Functioning/Environment Level of Independence: Independent        Comments: Indep with ADLs, household and community mobilization upon discharge; working full-time as Horticulturist, commercial.        OT Problem List: Decreased activity tolerance;Impaired balance (sitting and/or standing);Decreased knowledge of precautions;Decreased knowledge of use of DME or AE      OT Treatment/Interventions: Self-care/ADL training;Energy conservation;DME and/or AE instruction;Therapeutic activities;Patient/family education    OT Goals(Current goals can be found in the care plan section) Acute Rehab OT Goals Patient Stated Goal: "feel better again" OT Goal Formulation: With patient Time For Goal Achievement: 02/10/20 Potential to Achieve Goals: Good  OT Frequency: Min 2X/week   Barriers to D/C:            Co-evaluation              AM-PAC OT "6 Clicks" Daily Activity     Outcome Measure Help from another person eating meals?: None Help from another person taking care of personal grooming?: None(when seated) Help from another person toileting, which includes using toliet, bedpan, or urinal?: A Lot Help from another person bathing (including washing, rinsing, drying)?: A Lot Help from another person to  put on and taking off regular upper body clothing?: None Help from another person to put on and taking off regular lower body clothing?: A Lot 6 Click Score: 18   End of Session Equipment Utilized During Treatment: Rolling walker  Activity Tolerance: Patient limited by lethargy Patient left: in chair;with call bell/phone within reach;with chair alarm set  OT Visit Diagnosis: Unsteadiness on feet (R26.81);Other (comment)(R TKA)                Time: 2841-3244 OT Time Calculation (min): 17 min Charges:  OT General Charges $OT Visit: 1 Visit OT Evaluation $OT Eval Low Complexity: 1 Low OT Treatments $Self Care/Home Management : 8-22 mins  Baldomero Lamy, MS, OTR/L 01/27/20, 12:19 PM

## 2020-01-27 NOTE — Progress Notes (Signed)
Physical Therapy Treatment Patient Details Name: Amanda Price MRN: [id number] DOB: 03-25-1966 Today's Date: 01/27/2020    History of Present Illness admitted for acute hospitalization s/p R TKR, WBAT (01/26/20)    PT Comments    Pt is receiving PT s/p R TKA. Pt lives in an apartment with multiple stairs to enter, however, she is looking to be d/c to her sister's house with only 3 steps to enter. Pt was drowsy today from her pain medication, but she was eager and cooperative with PT. baseline pain a 8/10 with supine BP: 148/96. Sitting: 133/81 no Sx of [place]. Pt required mod A for bed mobility and transfers. Required stabilization of R LE and to maintain upright posture when standing. Ambulated to the bathroom where pt successfully urinated. Ambulated in the hall and pt reported knee pain "felt better" after the first 3 steps. Total ambulation distance ~160'. Pt felt hot which limited further ambulation. Left pt in recliner with call bell in reach and chair alarm. Therapist will return later for BID session with focus on increasing ROM and strength. Overall, pt tolerated session well. PT recommends d/c home with H/H PT.    Follow Up Recommendations  Home health PT     Equipment Recommendations  Rolling walker with 5" wheels;3in1 (PT)    Recommendations for Other Services       Precautions / Restrictions Precautions Precautions: Fall Restrictions Weight Bearing Restrictions: No RLE Weight Bearing: Weight bearing as tolerated    Mobility  Bed Mobility Overal bed mobility: Needs Assistance Bed Mobility: Supine to Sit     Supine to sit: Mod assist     General bed mobility comments: Pt req mod assist to stabilize R LE and required mod VC for hand placement  Transfers Overall transfer level: Needs assistance Equipment used: Rolling walker (2 wheeled) Transfers: Sit to/from Stand Sit to Stand: Min assist;Mod assist         General transfer comment: Pt required mod assist for R LE  stabilization and verbal and tactile cues for proper hand placement on walker and to maintain an upright posture when standing.   Ambulation/Gait Ambulation/Gait assistance: Mod assist;Min assist(+1 safey/equipment) Gait Distance (Feet): 120 Feet Assistive device: Rolling walker (2 wheeled) Gait Pattern/deviations: Step-to pattern     General Gait Details: forward flexed posture with heavy WBing bilat UEs; decreased step height/length bilat; consistent cuing for R foot flat and TKE in loading (tends to maintain in flexion). Mild decrease in alertness with transfer; improves with return to sitting/reclined position (BP 122/84, HR 106); suspect possible orthostasis.   Stairs Stairs: (did not attempt today)           Wheelchair Mobility    Modified Rankin (Stroke Patients Only)       Balance Overall balance assessment: Needs assistance Sitting-balance support: No upper extremity supported;Feet supported Sitting balance-Leahy Scale: Good     Standing balance support: Bilateral upper extremity supported Standing balance-Leahy Scale: Fair                              Cognition Arousal/Alertness: Awake/alert Behavior During Therapy: WFL for tasks assessed/performed Overall Cognitive Status: Within Functional Limits for tasks assessed                                 General Comments: Pt A&o x4      Exercises  General Comments        Pertinent Vitals/Pain Pain Assessment: 0-10 Pain Score: 8  Pain Location: medial aspect of right knee    Home Living                      Prior Function            PT Goals (current goals can now be found in the care plan section) Acute Rehab PT Goals Patient Stated Goal: to get this going PT Goal Formulation: With patient Time For Goal Achievement: 02/09/20 Potential to Achieve Goals: Good Progress towards PT goals: Progressing toward goals    Frequency    BID      PT Plan  Discharge plan needs to be updated    Co-evaluation              AM-PAC PT "6 Clicks" Mobility   Outcome Measure  Help needed turning from your back to your side while in a flat bed without using bedrails?: A Little Help needed moving from lying on your back to sitting on the side of a flat bed without using bedrails?: A Little Help needed moving to and from a bed to a chair (including a wheelchair)?: A Lot Help needed standing up from a chair using your arms (e.g., wheelchair or bedside chair)?: A Lot Help needed to walk in hospital room?: A Lot Help needed climbing 3-5 steps with a railing? : A Lot 6 Click Score: 14    End of Session Equipment Utilized During Treatment: Gait belt Activity Tolerance: Patient tolerated treatment well Patient left: in chair;with call bell/phone within reach;with chair alarm set Nurse Communication: Mobility status PT Visit Diagnosis: Muscle weakness (generalized) (M62.81);Difficulty in walking, not elsewhere classified (R26.2);Pain Pain - Right/Left: Right Pain - part of body: Knee     Time: 9179-1505 PT Time Calculation (min) (ACUTE ONLY): 47 min  Charges:  $Gait Training: 23-37 mins $Therapeutic Activity: 8-22 mins                     Julaine Fusi PTA 01/27/20, 10:35 AM

## 2020-01-28 ENCOUNTER — Inpatient Hospital Stay: Payer: 59

## 2020-01-28 ENCOUNTER — Encounter: Payer: Self-pay | Admitting: Orthopedic Surgery

## 2020-01-28 DIAGNOSIS — M79602 Pain in left arm: Secondary | ICD-10-CM

## 2020-01-28 DIAGNOSIS — R000 Tachycardia, unspecified: Secondary | ICD-10-CM | POA: Clinically undetermined

## 2020-01-28 LAB — URINALYSIS, COMPLETE (UACMP) WITH MICROSCOPIC
Bacteria, UA: NONE SEEN
Bilirubin Urine: NEGATIVE
Glucose, UA: NEGATIVE mg/dL
Ketones, ur: NEGATIVE mg/dL
Leukocytes,Ua: NEGATIVE
Nitrite: NEGATIVE
Protein, ur: NEGATIVE mg/dL
Specific Gravity, Urine: 1.023 (ref 1.005–1.030)
pH: 5 (ref 5.0–8.0)

## 2020-01-28 LAB — BASIC METABOLIC PANEL
Anion gap: 9 (ref 5–15)
BUN: 19 mg/dL (ref 6–20)
CO2: 27 mmol/L (ref 22–32)
Calcium: 9 mg/dL (ref 8.9–10.3)
Chloride: 98 mmol/L (ref 98–111)
Creatinine, Ser: 0.75 mg/dL (ref 0.44–1.00)
GFR calc Af Amer: >60 mL/min (ref >60)
GFR calc non Af Amer: >60 mL/min (ref >60)
Glucose, Bld: 139 mg/dL — ABNORMAL HIGH (ref 70–99)
Potassium: 4.3 mmol/L (ref 3.5–5.1)
Sodium: 134 mmol/L — ABNORMAL LOW (ref 135–145)

## 2020-01-28 LAB — CBC
HCT: 28.4 % — ABNORMAL LOW (ref 36.0–46.0)
Hemoglobin: 9.3 g/dL — ABNORMAL LOW (ref 12.0–15.0)
MCH: 26.7 pg (ref 26.0–34.0)
MCHC: 32.7 g/dL (ref 30.0–36.0)
MCV: 81.6 fL (ref 80.0–100.0)
Platelets: 200 10*3/uL (ref 150–400)
RBC: 3.48 MIL/uL — ABNORMAL LOW (ref 3.87–5.11)
RDW: 14.6 % (ref 11.5–15.5)
WBC: 15.7 10*3/uL — ABNORMAL HIGH (ref 4.0–10.5)
nRBC: 0 % (ref 0.0–0.2)

## 2020-01-28 LAB — FIBRIN DERIVATIVES D-DIMER (ARMC ONLY): Fibrin derivatives D-dimer (ARMC): 2795.14 ng/mL (FEU) — ABNORMAL HIGH (ref 0.00–499.00)

## 2020-01-28 LAB — T4, FREE: Free T4: 1.14 ng/dL — ABNORMAL HIGH (ref 0.61–1.12)

## 2020-01-28 LAB — TSH: TSH: 1.21 u[IU]/mL (ref 0.350–4.500)

## 2020-01-28 LAB — BRAIN NATRIURETIC PEPTIDE: B Natriuretic Peptide: 23 pg/mL (ref 0.0–100.0)

## 2020-01-28 MED ORDER — POTASSIUM CHLORIDE 20 MEQ PO PACK
20.0000 meq | PACK | Freq: Two times a day (BID) | ORAL | Status: DC
  Administered 2020-01-28: 10:00:00 20 meq via ORAL
  Filled 2020-01-28 (×2): qty 1

## 2020-01-28 MED ORDER — IOHEXOL 350 MG/ML SOLN
75.0000 mL | Freq: Once | INTRAVENOUS | Status: AC | PRN
  Administered 2020-01-28: 19:00:00 75 mL via INTRAVENOUS

## 2020-01-28 MED ORDER — ENOXAPARIN SODIUM 30 MG/0.3ML ~~LOC~~ SOLN
30.0000 mg | Freq: Two times a day (BID) | SUBCUTANEOUS | Status: DC
  Administered 2020-01-28 – 2020-01-30 (×4): 30 mg via SUBCUTANEOUS
  Filled 2020-01-28 (×3): qty 0.3

## 2020-01-28 MED ORDER — BISACODYL 5 MG PO TBEC
10.0000 mg | DELAYED_RELEASE_TABLET | Freq: Two times a day (BID) | ORAL | Status: DC
  Administered 2020-01-28 – 2020-01-30 (×4): 10 mg via ORAL
  Filled 2020-01-28 (×5): qty 2

## 2020-01-28 MED ORDER — POTASSIUM CHLORIDE 20 MEQ PO PACK
20.0000 meq | PACK | Freq: Two times a day (BID) | ORAL | Status: DC
  Administered 2020-01-28: 20 meq via ORAL

## 2020-01-28 MED ORDER — SODIUM CHLORIDE 0.9 % IV BOLUS
500.0000 mL | Freq: Once | INTRAVENOUS | Status: AC
  Administered 2020-01-28: 500 mL via INTRAVENOUS

## 2020-01-28 NOTE — Progress Notes (Signed)
Occupational Therapy Treatment Patient Details Name: Amanda Price MRN: [id number] DOB: 1966-04-03 Today's Date: 01/28/2020    History of present illness admitted for acute hospitalization s/p R TKR, WBAT (01/26/20)   OT comments  Pt seen for OT tx this date to f/u re: safety with ADLs and ADL mobility. Pt very fatigued and has apparently been throwing up this date. Pt's family member is present visiting and does request some information about polar care stating she will be helping care for pt. OT facilitates education with family member re: polar care mgt and compression stocking mgt. No further treatment completed at this time as pt reports feeling unable to get OOB. Anticipate HHOT will still be most appropriate d/c recommendation.    Follow Up Recommendations  Home health OT;Supervision/Assistance - 24 hour    Equipment Recommendations       Recommendations for Other Services      Precautions / Restrictions Precautions Precautions: Fall;Knee Precaution Booklet Issued: Yes (comment) Restrictions Weight Bearing Restrictions: Yes RLE Weight Bearing: Weight bearing as tolerated       Mobility Bed Mobility         General bed mobility comments: pt declines to get OOB at this time  Transfers         General transfer comment: deferred, pt declines to get OOB at this time   Balance                                           ADL either performed or assessed with clinical judgement   ADL                                         General ADL Comments: pt declines ADL participation this date. Apparently has been nauseated this date.     Vision Patient Visual Report: No change from baseline     Perception     Praxis      Cognition Arousal/Alertness: Lethargic Behavior During Therapy: WFL for tasks assessed/performed Overall Cognitive Status: Within Functional Limits for tasks assessed                                  General Comments: Pt A&o x4, some eye closing, somewhat slow to respond        Exercises Other Exercises Other Exercises: OT facilitates education with pt's family member who is present re: polar care mgt and compression stocking mgt.   Shoulder Instructions       General Comments      Pertinent Vitals/ Pain       Pain Assessment: Faces Pain Score: 3  Faces Pain Scale: Hurts little more Pain Location: Surgical knee Pain Descriptors / Indicators: Grimacing Pain Intervention(s): Monitored during session  Home Living                                          Prior Functioning/Environment              Frequency  Min 2X/week        Progress Toward Goals  OT Goals(current goals can now be found in the care plan  section)  Progress towards OT goals: OT to reassess next treatment  Acute Rehab OT Goals Patient Stated Goal: " feel better so I can go home" OT Goal Formulation: With patient Time For Goal Achievement: 02/10/20 Potential to Achieve Goals: Good  Plan Discharge plan remains appropriate    Co-evaluation                 AM-PAC OT "6 Clicks" Daily Activity     Outcome Measure   Help from another person eating meals?: None Help from another person taking care of personal grooming?: None Help from another person toileting, which includes using toliet, bedpan, or urinal?: A Lot Help from another person bathing (including washing, rinsing, drying)?: A Lot Help from another person to put on and taking off regular upper body clothing?: None Help from another person to put on and taking off regular lower body clothing?: A Lot 6 Click Score: 18    End of Session    OT Visit Diagnosis: Unsteadiness on feet (R26.81);Other abnormalities of gait and mobility (R26.89)   Activity Tolerance Patient limited by lethargy   Patient Left in bed;with call bell/phone within reach;with bed alarm set;with family/visitor present   Nurse  Communication          Time: 6237-6283 OT Time Calculation (min): 17 min  Charges: OT General Charges $OT Visit: 1 Visit OT Treatments $Self Care/Home Management : 8-22 mins   Rejeana Brock, MS, OTR/L ascom [phone] 01/28/20, 4:36 PM

## 2020-01-28 NOTE — Progress Notes (Signed)
Primary RN will try to start IV, will place IV team consult back if needed.

## 2020-01-28 NOTE — Progress Notes (Signed)
Physical Therapy Treatment Patient Details Name: Amanda Price MRN: [id number] DOB: 01-18-1966 Today's Date: 01/28/2020    History of Present Illness admitted for acute hospitalization s/p R TKR, WBAT (01/26/20)    PT Comments    Pt was seated in recliner upon arriving with RN in room. Very lethargic but agreeable to PT session. She was premedicated prior to therapist arriving. She reports sleeping in recliner with better sleep last PM. Upon sitting up, pt has severe episode of nausea and requesting to use emesis bag. Wet wash cloth applied to pt's neck and RN in room. Pt unable to progress with PT session and was repositioned back in recliner with call bell in reach and BLEs elevated with polar care and towel roll placed on RLE. Very poor intake per RN/Pt over past few days. Breakfast tray placed in front of pt with ginger ale. Therapist will return later this date and progress treatment as able per pt tolerance.     Follow Up Recommendations  Home health PT     Equipment Recommendations  Other (comment)(pt has equipment in room)    Recommendations for Other Services       Precautions / Restrictions Precautions Precautions: Fall;Knee Precaution Booklet Issued: Yes (comment) Restrictions Weight Bearing Restrictions: No RLE Weight Bearing: Weight bearing as tolerated    Mobility  Bed Mobility               General bed mobility comments: pt was in recliner pre/post session  Transfers Overall transfer level: Needs assistance Equipment used: Rolling walker (2 wheeled)             General transfer comment: unable to perform 2/2 to Nausea   Ambulation/Gait                 Stairs             Wheelchair Mobility    Modified Rankin (Stroke Patients Only)       Balance                                            Cognition Arousal/Alertness: Lethargic Behavior During Therapy: WFL for tasks assessed/performed Overall Cognitive  Status: Within Functional Limits for tasks assessed                                 General Comments: Pt A&o x4      Exercises      General Comments        Pertinent Vitals/Pain Pain Assessment: 0-10 Pain Score: 3  Faces Pain Scale: Hurts little more Pain Location: Surgical knee Pain Descriptors / Indicators: Aching;Discomfort;Grimacing Pain Intervention(s): Limited activity within patient's tolerance;Monitored during session;Premedicated before session;Repositioned;Ice applied    Home Living Family/patient expects to be discharged to:: Private residence Living Arrangements: Children                  Prior Function            PT Goals (current goals can now be found in the care plan section) Acute Rehab PT Goals Patient Stated Goal: " feel better so I can go home" Progress towards PT goals: Not progressing toward goals - comment(limityed by nausea)    Frequency    BID      PT Plan Current plan remains appropriate  Co-evaluation              AM-PAC PT "6 Clicks" Mobility   Outcome Measure  Help needed turning from your back to your side while in a flat bed without using bedrails?: A Little Help needed moving from lying on your back to sitting on the side of a flat bed without using bedrails?: A Little Help needed moving to and from a bed to a chair (including a wheelchair)?: A Little Help needed standing up from a chair using your arms (e.g., wheelchair or bedside chair)?: A Little Help needed to walk in hospital room?: A Little Help needed climbing 3-5 steps with a railing? : A Little 6 Click Score: 18    End of Session Equipment Utilized During Treatment: Gait belt Activity Tolerance: Treatment limited secondary to medical complications (Comment)(pt limited by nausea episode) Patient left: in chair;with call bell/phone within reach;with chair alarm set Nurse Communication: Mobility status PT Visit Diagnosis: Muscle weakness  (generalized) (M62.81);Difficulty in walking, not elsewhere classified (R26.2);Pain Pain - Right/Left: Right Pain - part of body: Knee     Time: 8453-6468 PT Time Calculation (min) (ACUTE ONLY): 8 min  Charges:  $Therapeutic Activity: 8-22 mins                    Julaine Fusi PTA 01/28/20, 10:18 AM

## 2020-01-28 NOTE — Consult Note (Signed)
[facility] Consultation Progress Note  Patient: Amanda Price RFX:[id number]   PCP: [facility], [place] DOB: 05-25-1966   DOA: 01/26/2020   DOS: 01/28/2020   Date of Service: the patient was seen and examined on 01/28/2020 Primary service: Kennedy Bucker, MD    Brief hospital course: Pt. with PMH of hypertension, right knee osteoarthritis, admitted on 01/26/2020, with complaint of right knee pain, s/p right total knee arthroplasty done on 01/26/2020 by Dr. Nunzio Cory orthopedic surgeon.  Since 12/30/2019 patient found to have sinus tachycardia and had some pain in the left arm while walking with walker.   Hospitalist consulted for further management of sinus tachycardia   Subjective: Patient was seen and examined at bedside, EKG showed sinus tachycardia, patient denied any cardiac issues in the past.  Denies any thyroid issues in the past. Patient denies shortness of breath, no palpitations, no chest pressure or tightness with deep breathing.  Patient did not move bowels for the past 2 days, no history of constipation.  Denies any dysuria.  Assessment and Plan: Active Problems:   Status post total knee replacement using cement, right  # Tachycardia developed on 01/17/2020 -EKG showed sinus tachycardia, to new to monitor on telemetry --Follow TSH free T4 level --Follow D-dimer, patient may need CTA chest to rule out PE --Follow UA and urine culture --Follow 2D echocardiogram  # s/p right total knee replacement  Continue as needed medications for pain control as per primary team  # Hypertension, patient is on lisinopril and hydrochlorothiazide  # Mild hypokalemia secondary to hydrochlorothiazide, potassium repleted.   Diet: Regular diet DVT Prophylaxis: Subcutaneous Lovenox 30  q12  Family Communication: family was not present at bedside, at the time of interview.  The pt provided permission to discuss medical plan with the family. Opportunity was given to ask question and all  questions were answered satisfactorily.    Disposition: Thank you for involving [place] in taking care of this patient, we will continue to follow the patient   Recommendation on discharge:   Other Consultants:  Procedures:  Antibiotics: Anti-infectives (From admission, onward)   Start     Dose/Rate Route Frequency Ordered Stop   01/27/20 0045  ceFAZolin (ANCEF) IVPB 2g/100 mL premix     2 g 200 mL/hr over 30 Minutes Intravenous  Once 01/27/20 0045 01/27/20 0118   01/26/20 1230  ceFAZolin (ANCEF) IVPB 2g/100 mL premix  Status:  Discontinued     2 g 200 mL/hr over 30 Minutes Intravenous Every 6 hours 01/26/20 1228 01/27/20 0045   01/26/20 0620  ceFAZolin (ANCEF) 2-4 GM/100ML-% IVPB    Note to Pharmacy: Mike Craze   : cabinet override      01/26/20 0620 01/26/20 1457   01/26/20 0615  ceFAZolin (ANCEF) IVPB 2g/100 mL premix     2 g 200 mL/hr over 30 Minutes Intravenous On call to O.R. 01/26/20 0610 01/26/20 0733      Objective: Physical Exam: Vitals:   01/27/20 2248 01/28/20 0857 01/28/20 0911 01/28/20 1510  BP: 123/86 101/69  114/79  Pulse: (!) 110 (!) 120 (!) 108 (!) 116  Resp: 16 18  18   Temp: 98 F (36.7 C) 97.7 F (36.5 C)  97.6 F (36.4 C)  TempSrc: Oral     SpO2: 99% 98% 96% 100%  Weight:      Height:        Intake/Output Summary (Last 24 hours) at 01/28/2020 1543 Last data filed at 01/28/2020 0735 Gross per 24 hour  Intake --  Output 0 ml  Net 0 ml   Filed Weights   01/26/20 0636  Weight: 84.7 kg   General: alert and oriented to time, place, and person. Appear in no acute distress, affect appropriate Eyes: PERRLA, Conjunctiva normal ENT: Oral Mucosa Clear, moist  Neck: no JVD, no Abnormal Mass Or lumps Cardiovascular: S1 and S2 Present, tachycardic, no Murmur, peripheral pulses symmetrical Respiratory: normal respiratory effort, Bilateral Air entry equal and Decreased, no use of accessory muscle, Clear to Auscultation, no Crackles, no wheezes Abdomen:  Bowel Sound present, Soft and no tenderness, no hernia Skin: no rashes  Extremities: no Pedal edema, no calf tenderness. S/p right total knee replacement, knee immobilizer  intact Neurologic: without any new focal findings Gait not checked due to patient safety concerns  Data Reviewed: CBC: Recent Labs  Lab 01/26/20 0708 01/26/20 1436 01/27/20 0541 01/28/20 1112  WBC  --  15.4* 10.3 15.7*  HGB 12.2 10.1* 8.7* 9.3*  HCT 36.0 30.7* 27.2* 28.4*  MCV  --  81.4 82.9 81.6  PLT  --  194 170 458   Basic Metabolic Panel: Recent Labs  Lab 01/26/20 0708 01/26/20 1436 01/27/20 0541 01/28/20 1112  NA 137  --  140 134*  K 3.2*  --  3.4* 4.3  CL 99  --  107 98  CO2  --   --  25 27  GLUCOSE 83  --  130* 139*  BUN 20  --  14 19  CREATININE 0.70 0.71 0.53 0.75  CALCIUM  --   --  7.6* 9.0   Liver Function Tests: No results for input(s): AST, ALT, ALKPHOS, BILITOT, PROT, ALBUMIN in the last 168 hours. No results for input(s): LIPASE, AMYLASE in the last 168 hours. No results for input(s): AMMONIA in the last 168 hours. Cardiac Enzymes: No results for input(s): CKTOTAL, CKMB, CKMBINDEX, TROPONINI in the last 168 hours. BNP (last 3 results) No results for input(s): BNP in the last 8760 hours. CBG: No results for input(s): GLUCAP in the last 168 hours. Recent Results (from the past 240 hour(s))  Surgical pcr screen     Status: Abnormal   Collection Time: 01/21/20  8:55 AM   Specimen: Nasal Mucosa; Nasal Swab  Result Value Ref Range Status   MRSA, PCR NEGATIVE NEGATIVE Final   Staphylococcus aureus POSITIVE (A) NEGATIVE Final    Comment: (NOTE) The Xpert SA Assay (FDA approved for NASAL specimens in patients 70 years of age and older), is one component of a comprehensive surveillance program. It is not intended to diagnose infection nor to guide or monitor treatment. Performed at [facility], [place], [place] [address]   SARS CORONAVIRUS 2 (TAT 6-24  HRS) Nasopharyngeal Nasopharyngeal Swab     Status: None   Collection Time: 01/22/20 10:56 AM   Specimen: Nasopharyngeal Swab  Result Value Ref Range Status   SARS Coronavirus 2 NEGATIVE NEGATIVE Final    Comment: (NOTE) SARS-CoV-2 target nucleic acids are NOT DETECTED. The SARS-CoV-2 RNA is generally detectable in upper and lower respiratory specimens during the acute phase of infection. Negative results do not preclude SARS-CoV-2 infection, do not rule out co-infections with other pathogens, and should not be used as the sole basis for treatment or other patient management decisions. Negative results must be combined with clinical observations, patient history, and epidemiological information. The expected result is Negative. Fact Sheet for Patients: SugarRoll.be Fact Sheet for Healthcare Providers: https://www.woods-mathews.com/ This test is not yet approved or cleared by  the [employer] and  has been authorized for detection and/or diagnosis of SARS-CoV-2 by FDA under an Emergency Use Authorization (EUA). This EUA will remain  in effect (meaning this test can be used) for the duration of the COVID-19 declaration under Section 56 4(b)(1) of the Act, 21 U.S.C. section 360bbb-3(b)(1), unless the authorization is terminated or revoked sooner. Performed at [facility], [address] [place], [place] [address]     Studies: No results found.   Scheduled Meds: . docusate sodium  100 mg Oral BID  . enoxaparin (LOVENOX) injection  30 mg Subcutaneous Q12H  . ferrous fumarate-b12-vitamic C-folic acid  1 capsule Oral BID  . losartan  100 mg Oral BH-q7a   And  . hydrochlorothiazide  25 mg Oral BH-q7a  . pantoprazole  40 mg Oral Daily  . potassium chloride  20 mEq Oral BID  . potassium chloride  20 mEq Oral BID  . traMADol  50 mg Oral Q6H   Continuous Infusions: . methocarbamol (ROBAXIN) IV     PRN Meds: acetaminophen, alum &  mag hydroxide-simeth, bisacodyl, diphenhydrAMINE, HYDROmorphone (DILAUDID) injection, magnesium citrate, magnesium hydroxide, menthol-cetylpyridinium **OR** phenol, methocarbamol **OR** methocarbamol (ROBAXIN) IV, metoCLOPramide **OR** metoCLOPramide (REGLAN) injection, ondansetron **OR** ondansetron (ZOFRAN) IV, oxyCODONE, oxyCODONE, zolpidem  Time spent: 35 minutes  Author: Gillis Santa  [facility] 01/28/2020 3:43 PM  To reach On-call, see care teams to locate the attending and reach out to them via [email or web address]. If 7PM-7AM, please contact night-coverage If you still have difficulty reaching the attending provider, please page the [facility] (Director on Call) for [facility] on amion for assistance.

## 2020-01-28 NOTE — Progress Notes (Signed)
   Subjective: 2 Days Post-Op Procedure(s) (LRB): TOTAL KNEE ARTHROPLASTY (Right) Patient reports pain as mild.   Patient is well, and has had no acute complaints or problems. Denies any CP, SOB, ABD pain.  No dizziness or lightheadedness.  No coughing. We will continue therapy today.  Plan is to go Home after hospital stay.  Objective: Vital signs in last 24 hours: Temp:  [97.7 F (36.5 C)-98 F (36.7 C)] 97.7 F (36.5 C) (03/18 0857) Pulse Rate:  [102-120] 108 (03/18 0911) Resp:  [16-18] 18 (03/18 0857) BP: (101-152)/(69-93) 101/69 (03/18 0857) SpO2:  [96 %-100 %] 96 % (03/18 0911)  Intake/Output from previous day: No intake/output data recorded. Intake/Output this shift: No intake/output data recorded.  Recent Labs    01/26/20 0708 01/26/20 1436 01/27/20 0541  HGB 12.2 10.1* 8.7*   Recent Labs    01/26/20 1436 01/27/20 0541  WBC 15.4* 10.3  RBC 3.77* 3.28*  HCT 30.7* 27.2*  PLT 194 170   Recent Labs    01/26/20 0708 01/26/20 0708 01/26/20 1436 01/27/20 0541  NA 137  --   --  140  K 3.2*  --   --  3.4*  CL 99  --   --  107  CO2  --   --   --  25  BUN 20  --   --  14  CREATININE 0.70   < > 0.71 0.53  GLUCOSE 83  --   --  130*  CALCIUM  --   --   --  7.6*   < > = values in this interval not displayed.   No results for input(s): LABPT, INR in the last 72 hours.  EXAM General - Patient is Alert, Appropriate and Oriented  Heart: Regular rhythm tachycardic Extremity - Neurovascular intact Sensation intact distally Intact pulses distally Dorsiflexion/Plantar flexion intact No cellulitis present Compartment soft Dressing - dressing C/D/I and no drainage, Praveena intact without drainage Motor Function - intact, moving foot and toes well on exam.   Past Medical History:  Diagnosis Date  . Anemia   . Arthritis   . Frequent headaches   . H/O breast biopsy    Left Breast  . Hypertension     Assessment/Plan:   2 Days Post-Op Procedure(s)  (LRB): TOTAL KNEE ARTHROPLASTY (Right) Active Problems:   Status post total knee replacement using cement, right   Acute post op blood loss anemia   Estimated body mass index is 33.08 kg/m as calculated from the following:   Height as of this encounter: 5\' 3"  (1.6 m).   Weight as of this encounter: 84.7 kg. Advance diet Up with therapy  Patient tachycardic.  Heart rate 108.  Remainder vital signs are stable.  Will give IV fluids and continue to monitor heart rate.  She is not having any chest pain or shortness of breath.  Rhythm is regular.  Pain moderate to severe, will trend continue with scheduled pain medications.  Acute post op blood loss anemia -hemoglobin 8.7.  Continue iron supplement and recheck hemoglobin today.  hypokalemia -potassium 3.4.  Serum potassium levels pending this morning.  Care manager to assist with discharge to home with home health PT tomorrow  DVT Prophylaxis - Lovenox, Foot Pumps and TED hose Weight-Bearing as tolerated to right leg   T. , PA-C [facility] 01/28/2020, 10:20 AM

## 2020-01-28 NOTE — Progress Notes (Signed)
Patient is c/o pain to left arm with +1 swelling noted with some numbness reported while working with PT, MD is aware and assessed and ordered 1 view xray. Also patient has elevated HR which MD is also aware and ordered 12 lead EKG.

## 2020-01-28 NOTE — Progress Notes (Signed)
Physical Therapy Treatment Patient Details Name: Amanda Price MRN: [id number] DOB: 03/03/1966 Today's Date: 01/28/2020    History of Present Illness admitted for acute hospitalization s/p R TKR, WBAT (01/26/20)    PT Comments    Pt was seated in recliner upon arriving with family members in room. She reports nausea is better this afternoon and agreeable to ambulation. She stood form recliner with supervision and was able to ambulate ~ 80 ft with supervision however was limited by LUE pain and profusely sweating. Several standing rest with pt leaning on arms resting on RW to rest. Pt continues to have very limited food intake. Overall pt tolerated PT better previous date and was able to ambulate greater distances and  performing stairs previously. Today unable to tolerate there ex or ROM and due to nausea. PT will continue to follow pt per POC and progress as able. Family was in room throughout session today. Therapist discussed with pt and family members discharge disposition. Both pt/pt's family feel comfortable about D/C to home with HHPT once all these recent medical complications resolve.     Follow Up Recommendations  Home health PT     Equipment Recommendations  Other (comment)(pt had all DME needs met)    Recommendations for Other Services       Precautions / Restrictions Precautions Precautions: Fall;Knee Precaution Booklet Issued: Yes (comment) Restrictions Weight Bearing Restrictions: No RLE Weight Bearing: Weight bearing as tolerated    Mobility  Bed Mobility Overal bed mobility: Needs Assistance Bed Mobility: Sit to Supine     Supine to sit: Mod assist     General bed mobility comments: Pt required mod assist to progress BLEs into bed  Transfers Overall transfer level: Needs assistance Equipment used: Rolling walker (2 wheeled) Transfers: Sit to/from Stand Sit to Stand: Supervision         General transfer comment: Increased time to perform with vcs for  handplacement and fwd wt shift.  Ambulation/Gait Ambulation/Gait assistance: Supervision Gait Distance (Feet): 80 Feet Assistive device: Rolling walker (2 wheeled) Gait Pattern/deviations: Step-through pattern Gait velocity: decreased   General Gait Details: Pt was only able to ambulate 80 ft this date 2/2 to severe c/o R UE fatigue/pain. Pt profusely sweating with minimal activity.   Stairs             Wheelchair Mobility    Modified Rankin (Stroke Patients Only)       Balance                                            Cognition Arousal/Alertness: Lethargic Behavior During Therapy: WFL for tasks assessed/performed Overall Cognitive Status: Within Functional Limits for tasks assessed                                 General Comments: Pt A&o x4      Exercises      General Comments        Pertinent Vitals/Pain Pain Assessment: 0-10 Pain Score: 3  Pain Location: Surgical knee Pain Descriptors / Indicators: Aching;Discomfort;Grimacing Pain Intervention(s): Limited activity within patient's tolerance;Monitored during session;Premedicated before session;Repositioned;Ice applied    Home Living                      Prior Function  PT Goals (current goals can now be found in the care plan section) Acute Rehab PT Goals Patient Stated Goal: " feel better so I can go home" Progress towards PT goals: Not progressing toward goals - comment(medical status limitaions with nausea/ LUE pain)    Frequency    BID      PT Plan Current plan remains appropriate    Co-evaluation              AM-PAC PT "6 Clicks" Mobility   Outcome Measure  Help needed turning from your back to your side while in a flat bed without using bedrails?: A Little Help needed moving from lying on your back to sitting on the side of a flat bed without using bedrails?: A Little Help needed moving to and from a bed to a chair  (including a wheelchair)?: A Little Help needed standing up from a chair using your arms (e.g., wheelchair or bedside chair)?: A Little Help needed to walk in hospital room?: A Little Help needed climbing 3-5 steps with a railing? : A Little 6 Click Score: 18    End of Session Equipment Utilized During Treatment: Gait belt Activity Tolerance: Patient limited by pain;Treatment limited secondary to medical complications (Comment) Patient left: in bed;with bed alarm set;with call bell/phone within reach;with family/visitor present;with nursing/sitter in room Nurse Communication: Mobility status PT Visit Diagnosis: Muscle weakness (generalized) (M62.81);Difficulty in walking, not elsewhere classified (R26.2);Pain Pain - Right/Left: Right Pain - part of body: Knee     Time: 1320-1408 PT Time Calculation (min) (ACUTE ONLY): 48 min  Charges:  $Gait Training: 8-22 mins $Therapeutic Activity: 23-37 mins                    Julaine Fusi PTA 01/28/20, 2:32 PM

## 2020-01-29 ENCOUNTER — Inpatient Hospital Stay (HOSPITAL_COMMUNITY)
Admission: RE | Admit: 2020-01-29 | Discharge: 2020-01-29 | Disposition: A | Discharge status: Home / Self Care | Payer: 59 | Source: Home / Self Care | Attending: Student | Admitting: Student

## 2020-01-29 DIAGNOSIS — R9431 Abnormal electrocardiogram [ECG] [EKG]: Secondary | ICD-10-CM

## 2020-01-29 LAB — BASIC METABOLIC PANEL
Anion gap: 9 (ref 5–15)
BUN: 24 mg/dL — ABNORMAL HIGH (ref 6–20)
CO2: 27 mmol/L (ref 22–32)
Calcium: 8.6 mg/dL — ABNORMAL LOW (ref 8.9–10.3)
Chloride: 99 mmol/L (ref 98–111)
Creatinine, Ser: 0.81 mg/dL (ref 0.44–1.00)
GFR calc Af Amer: >60 mL/min (ref >60)
GFR calc non Af Amer: >60 mL/min (ref >60)
Glucose, Bld: 130 mg/dL — ABNORMAL HIGH (ref 70–99)
Potassium: 3.6 mmol/L (ref 3.5–5.1)
Sodium: 135 mmol/L (ref 135–145)

## 2020-01-29 LAB — CBC
HCT: 24.4 % — ABNORMAL LOW (ref 36.0–46.0)
Hemoglobin: 7.9 g/dL — ABNORMAL LOW (ref 12.0–15.0)
MCH: 26.7 pg (ref 26.0–34.0)
MCHC: 32.4 g/dL (ref 30.0–36.0)
MCV: 82.4 fL (ref 80.0–100.0)
Platelets: 196 10*3/uL (ref 150–400)
RBC: 2.96 MIL/uL — ABNORMAL LOW (ref 3.87–5.11)
RDW: 14.6 % (ref 11.5–15.5)
WBC: 11.8 10*3/uL — ABNORMAL HIGH (ref 4.0–10.5)
nRBC: 0 % (ref 0.0–0.2)

## 2020-01-29 LAB — PREPARE RBC (CROSSMATCH)

## 2020-01-29 LAB — URINE CULTURE: Culture: <10000 — AB

## 2020-01-29 LAB — ECHOCARDIOGRAM COMPLETE
Height: 63 in
Weight: 2987.67 oz

## 2020-01-29 LAB — PHOSPHORUS: Phosphorus: 2.4 mg/dL — ABNORMAL LOW (ref 2.5–4.6)

## 2020-01-29 LAB — MAGNESIUM: Magnesium: 2.9 mg/dL — ABNORMAL HIGH (ref 1.7–2.4)

## 2020-01-29 LAB — HEMOGLOBIN AND HEMATOCRIT, BLOOD
HCT: 26.3 % — ABNORMAL LOW (ref 36.0–46.0)
Hemoglobin: 8.8 g/dL — ABNORMAL LOW (ref 12.0–15.0)

## 2020-01-29 MED ORDER — HYDROXYZINE HCL 25 MG PO TABS
25.0000 mg | ORAL_TABLET | Freq: Three times a day (TID) | ORAL | Status: DC | PRN
  Filled 2020-01-29: qty 1

## 2020-01-29 MED ORDER — SODIUM CHLORIDE 0.9 % IV BOLUS
500.0000 mL | Freq: Once | INTRAVENOUS | Status: AC
  Administered 2020-01-29: 09:00:00 500 mL via INTRAVENOUS

## 2020-01-29 MED ORDER — POTASSIUM PHOSPHATES 15 MMOLE/5ML IV SOLN
20.0000 mmol | Freq: Once | INTRAVENOUS | Status: AC
  Administered 2020-01-29: 20 mmol via INTRAVENOUS
  Filled 2020-01-29: qty 6.67

## 2020-01-29 MED ORDER — POLYETHYLENE GLYCOL 3350 17 G PO PACK
17.0000 g | PACK | Freq: Every day | ORAL | Status: DC
  Administered 2020-01-29 – 2020-01-30 (×2): 17 g via ORAL
  Filled 2020-01-29 (×2): qty 1

## 2020-01-29 MED ORDER — BISACODYL 10 MG RE SUPP
10.0000 mg | Freq: Once | RECTAL | Status: DC
  Filled 2020-01-29: qty 1

## 2020-01-29 MED ORDER — SODIUM CHLORIDE 0.9% IV SOLUTION: Freq: Once | INTRAVENOUS | Status: AC

## 2020-01-29 NOTE — Progress Notes (Signed)
*  PRELIMINARY RESULTS* Echocardiogram 2D Echocardiogram has been performed.  Cristela Blue 01/29/2020, 11:32 AM

## 2020-01-29 NOTE — Progress Notes (Addendum)
   Subjective: 3 Days Post-Op Procedure(s) (LRB): TOTAL KNEE ARTHROPLASTY (Right) Patient reports pain as mild.   Patient is well, and has had no acute complaints or problems. Denies any CP, SOB, ABD pain.  No dizziness or lightheadedness.  No coughing. Left arm pain with numbness along with tachycardia yesterday.  Medicine consulted, CTA chest - for PE. Continues to be tachy at 109 with no complaints. We will continue therapy today.  Plan is to go Home after hospital stay.  Objective: Vital signs in last 24 hours: Temp:  [97.6 F (36.4 C)-98.3 F (36.8 C)] 98.3 F (36.8 C) (03/19 0653) Pulse Rate:  [108-122] 109 (03/19 0653) Resp:  [16-18] 16 (03/19 0653) BP: (101-132)/(66-79) 132/68 (03/19 0653) SpO2:  [96 %-100 %] 100 % (03/19 0653)  Intake/Output from previous day: 03/18 0701 - 03/19 0700 In: 500 [IV Piggyback:500] Out: 0  Intake/Output this shift: No intake/output data recorded.  Recent Labs    01/26/20 1436 01/27/20 0541 01/28/20 1112 01/29/20 0413  HGB 10.1* 8.7* 9.3* 7.9*   Recent Labs    01/28/20 1112 01/29/20 0413  WBC 15.7* 11.8*  RBC 3.48* 2.96*  HCT 28.4* 24.4*  PLT 200 196   Recent Labs    01/28/20 1112 01/29/20 0413  NA 134* 135  K 4.3 3.6  CL 98 99  CO2 27 27  BUN 19 24*  CREATININE 0.75 0.81  GLUCOSE 139* 130*  CALCIUM 9.0 8.6*   No results for input(s): LABPT, INR in the last 72 hours.  EXAM General - Patient is Alert, Appropriate and Oriented  Heart: Regular rhythm tachycardic Extremity - Neurovascular intact Sensation intact distally Intact pulses distally Dorsiflexion/Plantar flexion intact No cellulitis present Compartment soft Dressing - dressing C/D/I and no drainage, Praveena intact without drainage Motor Function - intact, moving foot and toes well on exam.   Past Medical History:  Diagnosis Date  . Anemia   . Arthritis   . Frequent headaches   . H/O breast biopsy    Left Breast  . Hypertension      Assessment/Plan:   3 Days Post-Op Procedure(s) (LRB): TOTAL KNEE ARTHROPLASTY (Right) Active Problems:   Status post total knee replacement using cement, right   Sinus tachycardia   Acute post op blood loss anemia   Estimated body mass index is 33.08 kg/m as calculated from the following:   Height as of this encounter: 5\' 3"  (1.6 m).   Weight as of this encounter: 84.7 kg. Advance diet Up with therapy  Patient tachycardic.  Heart rate 109. NO CP/SOB. CTA chest negative for PE.  Will be transfusing 1 unit of packed red blood cells today due to hemoglobin of 7.9.  Appreciate internal medicine input.  Acute post op blood loss anemia -hemoglobin 7.9.  Transfuse 1 unit of packed red blood cells today.  hypokalemia -potassium 3.6  Care manager to assist with discharge to home with home health PT  DVT Prophylaxis - Lovenox, Foot Pumps and TED hose Weight-Bearing as tolerated to right leg   T. , PA-C [facility] 01/29/2020, 7:41 AM

## 2020-01-29 NOTE — Progress Notes (Signed)
IV team consulted for second PIV site. While placing in her left arm, she stated her right arm felt like she had a ton of bricks on it. Upon assessing her right arm, her PIV had infiltrated with NS bolus. After stopping fluids, elevating arm and placing warm compress, patient's pain improved. Good capillary refill, able to move fingers. Told patient to let nurse know if she noticed any numbness, tingling, coolness, or color changes in her right arm or hand. Patient's nurse made aware.

## 2020-01-29 NOTE — Progress Notes (Signed)
Occupational Therapy Treatment Patient Details Name: Amanda Price MRN: [id number] DOB: [birth year] Today's Date: 01/29/2020    History of present illness admitted for acute hospitalization s/p R TKR, WBAT (01/26/20)   OT comments  Pt seen for OT tx this date to f/u re: safety with self care ADLs/ADL mobility. Pt had just received blood and LPN okay's session having flushed IV and in process of checking VS. Pt seated in recliner when OT presents. Okay with attempting therapy. Pt agreeable to some seated therex and OT educates re: form and pace for modified chair push ups to strength tricep. Pt requires only MIN verbal/visual cues. Pt tolerates well. OT also educates pt re: modified technique with lateral lean to weight shift to scoot back for deeper seated position in chair. Pt demos good understanding and tolerance. Lastly, OT educates pt re: polar care mgt as pt was very lethargic during yesterday's session. Pt demos good understanding this date but would likely benefit from continued f/u with modified techniques and mgt of compression stockings/polar care before d/c home. Anticipate HHOT is still most appropriate d/c disposition to improve pt safety and indep for self care and ADL mobility in the home.   Follow Up Recommendations  Home health OT;Supervision/Assistance - 24 hour    Equipment Recommendations       Recommendations for Other Services      Precautions / Restrictions Precautions Precautions: Fall;Knee Restrictions Weight Bearing Restrictions: Yes RLE Weight Bearing: Weight bearing as tolerated       Mobility Bed Mobility               General bed mobility comments: pt recieved up in chair  Transfers                 General transfer comment: pt remains up in chair throughout session, no t/f this date as pt's food tray arrives and LPN needing to take VS    Balance                                           ADL either performed or assessed  with clinical judgement   ADL                                               Vision Patient Visual Report: No change from baseline     Perception     Praxis      Cognition Arousal/Alertness: Awake/alert Behavior During Therapy: WFL for tasks assessed/performed Overall Cognitive Status: Within Functional Limits for tasks assessed                                 General Comments: Pt much more alert and with increased wakefullness this date.        Exercises Other Exercises Other Exercises: OT facilitates education with pt who is more alert this date re: polar care mgt. Other Exercises: OT facilitates education and particiaption with pt in chair/modified tricep push ups to increase UE strength as it pertains to ADL transfers and fxl mobility with RW. Pt demos good understanding requiring only MIN visual/verbal cues for form. Pt completes 1 set x 10 reps   Shoulder Instructions  General Comments      Pertinent Vitals/ Pain       Pain Assessment: Faces Pain Score: 3  Pain Location: Surgical knee Pain Descriptors / Indicators: Grimacing Pain Intervention(s): Monitored during session  Home Living           Entrance Stairs-Number of Steps: full flight (separated by landing)                              Prior Functioning/Environment              Frequency  Min 2X/week        Progress Toward Goals  OT Goals(current goals can now be found in the care plan section)  Progress towards OT goals: Progressing toward goals  Acute Rehab OT Goals Patient Stated Goal: " feel better so I can go home" OT Goal Formulation: With patient Time For Goal Achievement: 02/10/20 Potential to Achieve Goals: Good  Plan Discharge plan remains appropriate    Co-evaluation                 AM-PAC OT "6 Clicks" Daily Activity     Outcome Measure   Help from another person eating meals?: None Help from another person  taking care of personal grooming?: None Help from another person toileting, which includes using toliet, bedpan, or urinal?: A Lot Help from another person bathing (including washing, rinsing, drying)?: A Lot Help from another person to put on and taking off regular upper body clothing?: None Help from another person to put on and taking off regular lower body clothing?: A Lot 6 Click Score: 18    End of Session    OT Visit Diagnosis: Unsteadiness on feet (R26.81);Other abnormalities of gait and mobility (R26.89)   Activity Tolerance Patient tolerated treatment well   Patient Left in chair;with call bell/phone within reach;with chair alarm set   Nurse Communication (nurse present throughout session)        Time: 1510-1533 OT Time Calculation (min): 23 min  Charges: OT General Charges $OT Visit: 1 Visit OT Treatments $Self Care/Home Management : 8-22 mins $Therapeutic Exercise: 8-22 mins  Rejeana Brock, MS, OTR/L ascom [phone] 01/29/20, 4:10 PM

## 2020-01-29 NOTE — Progress Notes (Signed)
PT Cancellation Note  Patient Details Name: [name] MRN: [id number] DOB: [birth year]   Cancelled Treatment:     PT attempt. Pt was having Echo and will be getting transfusion shortly. Therapist will return later this date after transfusion.    Rushie Chestnut 01/29/2020, 11:47 AM

## 2020-01-29 NOTE — Progress Notes (Signed)
Consent for Blood transfusion 1 unit signed/witnessed and placed in medical record

## 2020-01-29 NOTE — Progress Notes (Signed)
[facility] Progress Note  Patient: Amanda SilvasChante Price    ZOX:[id number]  DOA: 01/26/2020     Date of Service: the patient was seen and examined on 01/29/2020  chief complaint. Right knee osteoarthritis status post total knee replacement Sinus tachycardia  Brief hospital course: Pt. with PMH of hypertension, right knee osteoarthritis, admitted on 01/26/2020, with complaint of right knee pain, s/p right total knee arthroplasty done on 01/26/2020 by Dr. Nunzio CoryLyons orthopedic surgeon.  Since 12/30/2019 patient found to have sinus tachycardia and had some pain in the left arm while walking with walker.   Hospitalist consulted for further management of sinus tachycardia   Currently further plan is to monitor overnight for tachycardia and no further work-up patient can be discharged home tomorrow if remains asymptomatic and follow with PCP as an outpatient  Assessment and Plan:  # Tachycardia developed on 01/17/2020, could be due to dehydration vs anemia  -EKG showed sinus tachycardia, to new to monitor on telemetry --TSH 1.21 normal, free T4 level 1.14 minimally elevated, unlikely to cause tachycardia.  Recommend to repeat TSH level and free T4 level after few weeks and follow with PCP --D-dimer 2795 elevated, CTA chest negative for PE --UA negative and urine culture <10,000 COLONIES/mL INSIGNIFICANT GROWTH --2D echocardiogram LVEF 65 to 70%, normal function, LV hypertrophy, no significant valvular abnormality. --Try Atarax as needed for anxiety if that is contributing factor  # Acute blood loss anemia, s/p right total knee replacement Hb 12.3 > 8.7 >7.9 3/19 transfused 1 unit of PRBC as per primary team Blood transfusion would help to resolve tachycardia Continue monitor H&H   # s/p right total knee replacement  Continue as needed medications for pain control as per primary team  # Hypertension, patient is on lisinopril and hydrochlorothiazide  # Mild hypokalemia secondary to  hydrochlorothiazide, potassium repleted.    Body mass index is 33.08 kg/m.  Interventions:  Diet: Regular diet DVT Prophylaxis: Subcutaneous Lovenox 30 every 12 hours  Advance goals of care discussion: Full code  Family Communication: family was present at bedside, at the time of interview.  The pt provided permission to discuss medical plan with the family. Opportunity was given to ask question and all questions were answered satisfactorily.   Disposition:  Pt is from Home, admitted with right knee osteoarthritis for a total knee replacement, hospitalist consulted for sinus tachycardia, still has sinus tachycardia, which precludes a safe discharge. Discharge to home, when remains asymptomatic and tachycardia resolves. Patient can be discharged home tomorrow if remains asymptomatic  Subjective:  Patient was seen and examined at bedside, still patient is in sinus tachycardia on the monitor.  Denied any chest pain or palpitations.   Patient is feeling constipated did not move bowels yet, denies any abdominal pain, no nausea vomiting and no dysuria.   Physical Exam: General:  alert oriented to time, place, and person.  Appear in no distress, affect appropriate Eyes: PERRLA ENT: Oral Mucosa Clear, moist  Neck: no JVD,  Cardiovascular: S1 and S2 Present, no Murmur,  Respiratory: good respiratory effort, Bilateral Air entry equal and Decreased, no Crackles, no wheezes Abdomen: Bowel Sound present, Soft and no tenderness,  Skin: no rasehs Extremities: no Pedal edema, no calf tenderness Neurologic: without any new focal findings Gait not checked due to patient safety concerns  Vitals:   01/29/20 0822 01/29/20 1221 01/29/20 1246 01/29/20 1535  BP: 119/80 117/63 118/79 107/68  Pulse: (!) 126 (!) 111 (!) 119 (!) 123  Resp: 16  Temp: 98.3 F (36.8 C) 98.8 F (37.1 C) 98.4 F (36.9 C) 99.2 F (37.3 C)  TempSrc:  Oral Oral Oral  SpO2: 100% 100% 100% 100%  Weight:        Height:        Intake/Output Summary (Last 24 hours) at 01/29/2020 1607 Last data filed at 01/29/2020 1535 Gross per 24 hour  Intake 366 ml  Output --  Net 366 ml   Filed Weights   01/26/20 0636  Weight: 84.7 kg    Data Reviewed: I have personally reviewed and interpreted daily labs, tele strips, imagings as discussed above. I reviewed all nursing notes, pharmacy notes, vitals, pertinent old records I have discussed plan of care as described above with RN and patient/family.  CBC: Recent Labs  Lab 01/26/20 0708 01/26/20 1436 01/27/20 0541 01/28/20 1112 01/29/20 0413  WBC  --  15.4* 10.3 15.7* 11.8*  HGB 12.2 10.1* 8.7* 9.3* 7.9*  HCT 36.0 30.7* 27.2* 28.4* 24.4*  MCV  --  81.4 82.9 81.6 82.4  PLT  --  194 170 200 196   Basic Metabolic Panel: Recent Labs  Lab 01/26/20 0708 01/26/20 1436 01/27/20 0541 01/28/20 1112 01/29/20 0413  NA 137  --  140 134* 135  K 3.2*  --  3.4* 4.3 3.6  CL 99  --  107 98 99  CO2  --   --  25 27 27   GLUCOSE 83  --  130* 139* 130*  BUN 20  --  14 19 24*  CREATININE 0.70 0.71 0.53 0.75 0.81  CALCIUM  --   --  7.6* 9.0 8.6*  MG  --   --   --   --  2.9*  PHOS  --   --   --   --  2.4*    Studies: CT ANGIO CHEST PE W OR WO CONTRAST  Result Date: 01/28/2020 CLINICAL DATA:  Pt. with PMH of hypertension, right knee osteoarthritis, admitted on 01/26/2020, with complaint of right knee pain, s/p right total knee arthroplasty done on 01/26/2020 by Dr. 01/28/2020 orthopedic Nunzio Cory. Since 12/30/2019 patient found to have sinus tachycardia and had some pain in the left arm while walking with walker. Hospitalist consulted for further management of sinus tachycardia EXAM: CT ANGIOGRAPHY CHEST WITH CONTRAST TECHNIQUE: Multidetector CT imaging of the chest was performed using the standard protocol during bolus administration of intravenous contrast. Multiplanar CT image reconstructions and MIPs were obtained to evaluate the vascular anatomy. CONTRAST:  56mL  OMNIPAQUE IOHEXOL 350 MG/ML SOLN, 47mL OMNIPAQUE IOHEXOL 350 MG/ML SOLN COMPARISON:  None. FINDINGS: Cardiovascular: Patient was injected and image twice in an attempt to optimally opacify the pulmonary arteries. The pulmonary arteries are suboptimally opacified on both acquisitions, essentially making the exam nondiagnostic for pulmonary emboli. There is no evidence of a large central pulmonary embolus. Heart is normal in size and configuration. No coronary artery calcifications or pericardial effusion. Great vessels normal caliber. No aortic dissection or atherosclerosis. Mediastinum/Nodes: No enlarged mediastinal, hilar, or axillary lymph nodes. Thyroid gland, trachea, and esophagus demonstrate no significant findings. Lungs/Pleura: Calcified granuloma, left upper lobe, image 28, series 8. Lungs otherwise clear. No pleural effusion or pneumothorax. Upper Abdomen: No acute or significant abnormalities. Musculoskeletal: No fracture or acute finding. No osteoblastic or osteolytic lesions. Review of the MIP images confirms the above findings. IMPRESSION: 1. Nondiagnostic exam for the assessment for pulmonary emboli. Despite repeating the exam, the pulmonary arteries were poorly opacified. Allowing for the limitation, there is no  evidence of a large central pulmonary embolus. Consider either repeating the exam in 12-24 hours or obtaining a nuclear medicine perfusion study to assess for pulmonary emboli. 2. No acute findings. Lungs are clear other than a single calcified granuloma. Normal heart and great vessels. Electronically Signed   By: Amie Portland M.D.   On: 01/28/2020 19:23   ECHOCARDIOGRAM COMPLETE  Result Date: 01/29/2020    ECHOCARDIOGRAM REPORT   Patient Name:   ZAHAVA QUANT Date of Exam: 01/29/2020 Medical Rec #:  [id number]   Height:       63.0 in Accession #:    [id number]  Weight:       186.7 lb Date of Birth:  [birth year]    BSA:          1.878 m Patient Age:    53 years    BP:           119/80 mmHg  Patient Gender: F           HR:           126 bpm. Exam Location:  [facility] Procedure: 2D Echo, Cardiac Doppler and Color Doppler Indications:     Abnormal ECG 794.31  History:         Patient has no prior history of Echocardiogram examinations.                  Risk Factors:Hypertension.  Sonographer:     Cristela Blue RDCS (AE) Referring Phys:  [id number] Gillis Santa Diagnosing Phys: Yvonne Kendall MD IMPRESSIONS  1. Left ventricular ejection fraction, by estimation, is 65 to 70%. The left ventricle has normal function. Left ventricular endocardial border not optimally defined to evaluate regional wall motion. There is mild left ventricular hypertrophy. Indeterminate diastolic filling due to E-A fusion.  2. Right ventricular systolic function is normal. The right ventricular size is normal. Tricuspid regurgitation signal is inadequate for assessing PA pressure.  3. The mitral valve is grossly normal. Trivial mitral valve regurgitation. No evidence of mitral stenosis.  4. The aortic valve was not well visualized. Aortic valve regurgitation is not visualized. No aortic stenosis is present.  5. The inferior vena cava is normal in size with greater than 50% respiratory variability, suggesting right atrial pressure of 3 mmHg. FINDINGS  Left Ventricle: Left ventricular ejection fraction, by estimation, is 65 to 70%. The left ventricle has normal function. Left ventricular endocardial border not optimally defined to evaluate regional wall motion. The left ventricular internal cavity size was normal in size. There is mild left ventricular hypertrophy. Indeterminate diastolic filling due to E-A fusion. Right Ventricle: The right ventricular size is normal. No increase in right ventricular wall thickness. Right ventricular systolic function is normal. Tricuspid regurgitation signal is inadequate for assessing PA pressure. Left Atrium: Left atrial size was normal in size. Right Atrium: Right atrial size was normal in size.  Pericardium: The pericardium was not well visualized. Mitral Valve: The mitral valve is grossly normal. Trivial mitral valve regurgitation. No evidence of mitral valve stenosis. Tricuspid Valve: The tricuspid valve is not well visualized. Tricuspid valve regurgitation is trivial. Aortic Valve: The aortic valve was not well visualized. Aortic valve regurgitation is not visualized. No aortic stenosis is present. Aortic valve mean gradient measures 5.0 mmHg. Aortic valve peak gradient measures 8.4 mmHg. Aortic valve area, by VTI measures 3.59 cm. Pulmonic Valve: The pulmonic valve was not well visualized. Pulmonic valve regurgitation is not visualized. No evidence of pulmonic stenosis. Aorta: The aortic root  is normal in size and structure. Pulmonary Artery: The pulmonary artery is not well seen. Venous: The inferior vena cava is normal in size with greater than 50% respiratory variability, suggesting right atrial pressure of 3 mmHg. IAS/Shunts: The interatrial septum was not well visualized.  LEFT VENTRICLE PLAX 2D LVIDd:         3.22 cm  Diastology LVIDs:         2.07 cm  LV e' lateral:   12.30 cm/s LV PW:         1.07 cm  LV E/e' lateral: 6.1 LV IVS:        0.77 cm  LV e' medial:    6.53 cm/s LVOT diam:     2.00 cm  LV E/e' medial:  11.5 LV SV:         89 LV SV Index:   47 LVOT Area:     3.14 cm  RIGHT VENTRICLE RV Basal diam:  2.64 cm RV S prime:     17.50 cm/s TAPSE (M-mode): 3.3 cm LEFT ATRIUM             Index       RIGHT ATRIUM           Index LA diam:        2.70 cm 1.44 cm/m  RA Area:     11.52 cm 6.14 cm/m LA Vol (A2C):   28.0 ml 14.91 ml/m RA Volume:   24.40 ml  12.99 ml/m LA Vol (A4C):   25.2 ml 13.42 ml/m LA Biplane Vol: 27.0 ml 14.38 ml/m  AORTIC VALVE                    PULMONIC VALVE AV Area (Vmax):    2.66 cm     PV Vmax:        1.01 m/s AV Area (Vmean):   2.42 cm     PV Peak grad:   4.1 mmHg AV Area (VTI):     3.59 cm     RVOT Peak grad: 4 mmHg AV Vmax:           145.00 cm/s AV Vmean:           103.300 cm/s AV VTI:            0.247 m AV Peak Grad:      8.4 mmHg AV Mean Grad:      5.0 mmHg LVOT Vmax:         123.00 cm/s LVOT Vmean:        79.500 cm/s LVOT VTI:          0.282 m LVOT/AV VTI ratio: 1.14  AORTA Ao Root diam: 2.90 cm MITRAL VALVE MV Area (PHT): 5.54 cm     SHUNTS MV Decel Time: 137 msec     Systemic VTI:  0.28 m MV E velocity: 75.20 cm/s   Systemic Diam: 2.00 cm MV A velocity: 108.00 cm/s MV E/A ratio:  0.70 Cristal Deer End MD Electronically signed by Yvonne Kendall MD Signature Date/Time: 01/29/2020/3:39:26 PM    Final     Scheduled Meds: . sodium chloride   Intravenous Once  . bisacodyl  10 mg Oral BID  . docusate sodium  100 mg Oral BID  . enoxaparin (LOVENOX) injection  30 mg Subcutaneous Q12H  . ferrous fumarate-b12-vitamic C-folic acid  1 capsule Oral BID  . losartan  100 mg Oral BH-q7a   And  . hydrochlorothiazide  25 mg Oral BH-q7a  .  pantoprazole  40 mg Oral Daily  . polyethylene glycol  17 g Oral Daily  . traMADol  50 mg Oral Q6H   Continuous Infusions: . methocarbamol (ROBAXIN) IV    . potassium PHOSPHATE IVPB (in mmol) 20 mmol (01/29/20 1031)   PRN Meds: acetaminophen, alum & mag hydroxide-simeth, bisacodyl, diphenhydrAMINE, HYDROmorphone (DILAUDID) injection, magnesium citrate, magnesium hydroxide, menthol-cetylpyridinium **OR** phenol, methocarbamol **OR** methocarbamol (ROBAXIN) IV, metoCLOPramide **OR** metoCLOPramide (REGLAN) injection, ondansetron **OR** ondansetron (ZOFRAN) IV, oxyCODONE, oxyCODONE, zolpidem  Time spent: 35 minutes  Author: Val Riles. MD [facility] 01/29/2020 4:07 PM  To reach On-call, see care teams to locate the attending and reach out to them via [email or web address]. If 7PM-7AM, please contact night-coverage If you still have difficulty reaching the attending provider, please page the [facility] (Director on Call) for [facility] on amion for assistance.

## 2020-01-29 NOTE — Progress Notes (Signed)
PT Cancellation Note  Patient Details Name: Amanda Price MRN: [id number] DOB: Feb 01, 1966   Cancelled Treatment:     PT attempt. 2nd attempt today. Pt currentl;y eating lunch tray. Therapist reviewed there ex handout and pt reports she will perform. Therapist will return first this tomorrow morning to progress pt with ambulation, ROM, and strengthening per POC   Rushie Chestnut 01/29/2020, 4:04 PM

## 2020-01-30 DIAGNOSIS — Z96651 Presence of right artificial knee joint: Secondary | ICD-10-CM

## 2020-01-30 DIAGNOSIS — R000 Tachycardia, unspecified: Secondary | ICD-10-CM

## 2020-01-30 LAB — BASIC METABOLIC PANEL
Anion gap: 11 (ref 5–15)
BUN: 21 mg/dL — ABNORMAL HIGH (ref 6–20)
CO2: 26 mmol/L (ref 22–32)
Calcium: 8.6 mg/dL — ABNORMAL LOW (ref 8.9–10.3)
Chloride: 94 mmol/L — ABNORMAL LOW (ref 98–111)
Creatinine, Ser: 0.7 mg/dL (ref 0.44–1.00)
GFR calc Af Amer: >60 mL/min (ref >60)
GFR calc non Af Amer: >60 mL/min (ref >60)
Glucose, Bld: 116 mg/dL — ABNORMAL HIGH (ref 70–99)
Potassium: 3.7 mmol/L (ref 3.5–5.1)
Sodium: 131 mmol/L — ABNORMAL LOW (ref 135–145)

## 2020-01-30 LAB — TYPE AND SCREEN
ABO/RH(D): O POS
Antibody Screen: NEGATIVE
Unit division: 0

## 2020-01-30 LAB — BPAM RBC
Blood Product Expiration Date: 202103192359
ISSUE DATE / TIME: 202103191212
Unit Type and Rh: 9500

## 2020-01-30 LAB — MAGNESIUM: Magnesium: 2.4 mg/dL (ref 1.7–2.4)

## 2020-01-30 LAB — PHOSPHORUS: Phosphorus: 3.2 mg/dL (ref 2.5–4.6)

## 2020-01-30 MED ORDER — METOPROLOL TARTRATE 25 MG PO TABS
25.0000 mg | ORAL_TABLET | Freq: Two times a day (BID) | ORAL | Status: DC
  Administered 2020-01-30: 10:00:00 25 mg via ORAL
  Filled 2020-01-30: qty 1

## 2020-01-30 MED ORDER — METOPROLOL TARTRATE 25 MG PO TABS: 25.0000 mg | ORAL_TABLET | Freq: Two times a day (BID) | ORAL | 0 refills | Status: DC

## 2020-01-30 NOTE — Progress Notes (Signed)
Dr. Joice Lofts paged. Prevana wound vac not working. No drainage in large wound vac. Per MD, remove wound vac and place honeycomb dressing. Charge nurse aware.

## 2020-01-30 NOTE — Progress Notes (Addendum)
Subjective: 4 Days Post-Op Procedure(s) (LRB): TOTAL KNEE ARTHROPLASTY (Right) Patient reports pain as mild.   Patient is still tachycardic, HR 117 this morning. Denies any CP, SOB, ABD pain.  No dizziness or lightheadedness.  No coughing. We will continue therapy today.  Plan is to go Home after hospital stay.  Objective: Vital signs in last 24 hours: Temp:  [98.4 F (36.9 C)-99.2 F (37.3 C)] 98.6 F (37 C) (03/20 0801) Pulse Rate:  [111-124] 124 (03/20 0801) Resp:  [16-20] 16 (03/20 0801) BP: (107-130)/(63-82) 130/82 (03/20 0801) SpO2:  [99 %-100 %] 99 % (03/20 0801)  Intake/Output from previous day: 03/19 0701 - 03/20 0700 In: 748.5 [Blood:366; IV Piggyback:382.5] Out: 600 [Urine:600] Intake/Output this shift: No intake/output data recorded.  Recent Labs    01/28/20 1112 01/29/20 0413 01/29/20 1827  HGB 9.3* 7.9* 8.8*   Recent Labs    01/28/20 1112 01/28/20 1112 01/29/20 0413 01/29/20 1827  WBC 15.7*  --  11.8*  --   RBC 3.48*  --  2.96*  --   HCT 28.4*   < > 24.4* 26.3*  PLT 200  --  196  --    < > = values in this interval not displayed.   Recent Labs    01/29/20 0413 01/30/20 0651  NA 135 131*  K 3.6 3.7  CL 99 94*  CO2 27 26  BUN 24* 21*  CREATININE 0.81 0.70  GLUCOSE 130* 116*  CALCIUM 8.6* 8.6*   No results for input(s): LABPT, INR in the last 72 hours.  EXAM General - Patient is Alert, Appropriate and Oriented  Heart: Regular rhythm tachycardic Extremity - Neurovascular intact Sensation intact distally Intact pulses distally Dorsiflexion/Plantar flexion intact No cellulitis present Compartment soft Dressing - dressing C/D/I and no drainage, Praveena intact without drainage Motor Function - intact, moving foot and toes well on exam.   Past Medical History:  Diagnosis Date  . Anemia   . Arthritis   . Frequent headaches   . H/O breast biopsy    Left Breast  . Hypertension     Assessment/Plan:   4 Days Post-Op Procedure(s)  (LRB): TOTAL KNEE ARTHROPLASTY (Right) Active Problems:   Status post total knee replacement using cement, right   Sinus tachycardia   Acute post op blood loss anemia   Estimated body mass index is 33.08 kg/m as calculated from the following:   Height as of this encounter: 5\' 3"  (1.6 m).   Weight as of this encounter: 84.7 kg. Advance diet Up with therapy   Patient has had a small BM. S/P transfusion, Hg 8.8 this AM. Patient tachycardic.  Heart rate 117. NO CP/SOB. CTA chest negative for PE.  Pain is mild. Patient is asymptomatic.  Appreciate internal medicine consult, added Atarax yesterday.  Continue to monitor.   Will place on Metoprolol 25mg  BID today. Hypokelemia resolve, K+ 3.7, Na 131. Plan is still for d/c home with HHPT.  Will wait on internal medicine input today, with patient being asymptomatic did discuss discharge and follow-up with PCP/Cardiology on a outpatient basis.  Addendum 01/30/20 1:55 pm: Patient started on Metoprolol 25mg  BID.  HR down to 94 on the medication.  Will plan for discharge today on Metoprolol.  Follow-up this week with PCP for further evaluation.  DVT Prophylaxis - Lovenox, Foot Pumps and TED hose Weight-Bearing as tolerated to right leg  J. , PA-C [facility] 01/30/2020, 9:00 AM

## 2020-01-30 NOTE — TOC Transition Note (Signed)
Transition of Care [facility]) - CM/SW Discharge Note   Patient Details  Name: Amanda Price MRN: [id number] Date of Birth: [birth year]  Transition of Care [facility]) CM/SW Contact:  Maud Deed, LCSW Phone [phone] 01/30/2020, 2:55 PM   Clinical Narrative:    Pt medically stable  for discharge. She will be transported home by her son.[name] with [facility] has been notified of pt's discharge.   Final next level of care: Home w Home Health Services Barriers to Discharge: No Barriers Identified   Patient Goals and CMS Choice Patient states their goals for this hospitalization and ongoing recovery are:: go home CMS Medicare.gov Compare Post Acute Care list provided to:: Patient Choice offered to / list presented to : Patient  Discharge Placement                Patient to be transferred to facility by: Son Name of family member notified: [name] Patient and family notified of of transfer: 01/30/20  Discharge Plan and Services   Discharge Planning Services: CM Consult Post Acute Care Choice: Home Health          DME Arranged: 3-N-1, Walker rolling DME Agency: [employer] Date DME Agency Contacted: 01/27/20 Time DME Agency Contacted: 0300 Representative spoke with at DME Agency: Nida Boatman HH Arranged: PT HH Agency: [facility] (formerly [facility]) Date HH Agency Contacted: 01/30/20 Time HH Agency Contacted: 1452 Representative spoke with at [facility] Agency: [name]  Social Determinants of Health (SDOH) Interventions     Readmission Risk Interventions No flowsheet data found.

## 2020-01-30 NOTE — Discharge Instructions (Signed)

## 2020-01-30 NOTE — Progress Notes (Signed)
Physical Therapy Treatment Patient Details Name: Amanda Price MRN: [id number] DOB: 08-26-1966 Today's Date: 01/30/2020    History of Present Illness admitted for acute hospitalization s/p R TKR, WBAT (01/26/20)    PT Comments    Pt was seated in recliner upon arriving. She agrees to PT session and reports 4/10 pain in R knee. Pt's resting HR 108 bpm. She is asymptomatic throughout session and very motivated to participate. Pt was able to stand and ambulate ~ 100 ft with RW with supervision only. No LOB or unsteadiness. Pt HR does elevated to 150 with minimal distance and was returned to sitting in recliner. After sitting x 2 minutes HR decreased to 120s. Overall medical complications limiting progressing with PT. She was able to perform stairs and ambulate > 160 ft several days prior. PT progress complicated by low HGB/tachycardia/ pain over hospital stay however therapist feels pt is still safe to DC to home with HHPT. She has supportive family that are willing to take care of her at DC. Pt was resting in recliner post session with call bell in reach and RN aware of HR concerns.      Follow Up Recommendations  Home health PT     Equipment Recommendations  Other (comment)(pt has personal equipment in room)    Recommendations for Other Services       Precautions / Restrictions Precautions Precautions: Fall;Knee Precaution Booklet Issued: Yes (comment) Restrictions Weight Bearing Restrictions: Yes RLE Weight Bearing: Weight bearing as tolerated    Mobility  Bed Mobility               General bed mobility comments: pt was in recliner pre/post session. she does require alot of assistance supporting RLE to floor when recliner is reclined  Transfers Overall transfer level: Needs assistance Equipment used: Rolling walker (2 wheeled) Transfers: Sit to/from Stand Sit to Stand: Supervision         General transfer comment: Increased time to perform. Pt cued for technique  improvements however did not require lifting assistance or pohysical assistance to stand  Ambulation/Gait Ambulation/Gait assistance: Supervision Gait Distance (Feet): 100 Feet Assistive device: Rolling walker (2 wheeled) Gait Pattern/deviations: Step-to pattern;Step-through pattern Gait velocity: decreased   General Gait Details: pt's HR 108bpm prior to gait that elevated to 150bpm. pt was assymptomatic throughout however therapist did not feel comfortable allowing pt to ambulate further distances. She was able to progress from step to to step through gait pattern.   Stairs             Wheelchair Mobility    Modified Rankin (Stroke Patients Only)       Balance                                            Cognition Arousal/Alertness: Awake/alert Behavior During Therapy: WFL for tasks assessed/performed Overall Cognitive Status: Within Functional Limits for tasks assessed                                 General Comments: pt much more alert this date and very motivated. Agrees to PT session. she reports MD states she will need outpatient follow up with cardiology 2/2 to HR tachy      Exercises General Exercises - Lower Extremity Ankle Circles/Pumps: Both;15 reps Quad Sets: AROM;Right;10 reps Gluteal Sets: AROM;10  reps Heel Slides: AROM;Right;10 reps Hip ABduction/ADduction: AAROM;Right;10 reps Straight Leg Raises: AAROM;Right;10 reps    General Comments        Pertinent Vitals/Pain Pain Assessment: 0-10 Pain Score: 4  Faces Pain Scale: Hurts a little bit Pain Location: Surgical knee Pain Descriptors / Indicators: Grimacing Pain Intervention(s): Limited activity within patient's tolerance;Monitored during session;Premedicated before session;Repositioned;Ice applied    Home Living                      Prior Function            PT Goals (current goals can now be found in the care plan section) Acute Rehab PT  Goals Patient Stated Goal: " I want to go home" Progress towards PT goals: Progressing toward goals    Frequency    BID      PT Plan Current plan remains appropriate    Co-evaluation              AM-PAC PT "6 Clicks" Mobility   Outcome Measure  Help needed turning from your back to your side while in a flat bed without using bedrails?: A Little Help needed moving from lying on your back to sitting on the side of a flat bed without using bedrails?: A Little Help needed moving to and from a bed to a chair (including a wheelchair)?: A Little Help needed standing up from a chair using your arms (e.g., wheelchair or bedside chair)?: A Little Help needed to walk in hospital room?: A Little Help needed climbing 3-5 steps with a railing? : A Little 6 Click Score: 18    End of Session Equipment Utilized During Treatment: Gait belt Activity Tolerance: Patient limited by pain(limited by tachycardia) Patient left: in chair;with chair alarm set;with call bell/phone within reach;with nursing/sitter in room Nurse Communication: Mobility status PT Visit Diagnosis: Muscle weakness (generalized) (M62.81);Difficulty in walking, not elsewhere classified (R26.2);Pain Pain - Right/Left: Right Pain - part of body: Knee     Time: 7209-4709 PT Time Calculation (min) (ACUTE ONLY): 33 min  Charges:  $Gait Training: 8-22 mins $Therapeutic Exercise: 8-22 mins                     Jetta Lout PTA 01/30/20, 9:45 AM

## 2020-01-30 NOTE — Progress Notes (Signed)
[facility] Progress Note  Patient: Amanda Price    ERX:[id number]  DOA: 01/26/2020     Date of Service: the patient was seen and examined on 01/30/2020  chief complaint. Right knee osteoarthritis status post total knee replacement Sinus tachycardia  Brief hospital course: Pt. with PMH of hypertension, right knee osteoarthritis, admitted on 01/26/2020, with complaint of right knee pain, s/p right total knee arthroplasty done on 01/26/2020 by Dr. Nunzio Cory orthopedic surgeon.  Since 12/30/2019 patient found to have sinus tachycardia and had some pain in the left arm while walking with walker.   Hospitalist consulted for further management of sinus tachycardia   Patient remains in ST, rates 110-120.  Asymptomatic, no chest pain or SOB.  Workup unrevealing thus far.  Assessment and Plan:  # Tachycardia  developed on 01/17/2020, could be due to dehydration vs anemia  -EKG showed sinus tachycardia, to new to monitor on telemetry --TSH 1.21 normal, free T4 level 1.14 minimally elevated, unlikely to cause tachycardia.  Recommend to repeat TSH level and free T4 level after few weeks and follow with PCP --D-dimer 2795 elevated, CTA chest negative for PE --UA negative and urine culture <10,000 COLONIES/mL INSIGNIFICANT GROWTH --2D echocardiogram LVEF 65 to 70%, normal function, LV hypertrophy, no significant valvular abnormality. Recommendations: Ensure adequate PO intake Continue atarax prn anxiety Ambulate Ensure pain control Monitor for BM No specific inpatient evaluation indicated at this time Recommend outpatient PCP followup in 5-7 days Consider referral to cardiology for outpatient Holter monitoring  Discussed recommendations with Blanchard Mane PA-C  # Acute blood loss anemia, s/p right total knee replacement Hb 12.3 > 8.7 >7.9 3/19 transfused 1 unit of PRBC as per primary team Hb responded appropriately to 8.8 Plan: Monitor for bleeding in house Repeat labs within 1 week as  outpatient    # s/p right total knee replacement  Continue as needed medications for pain control as per primary team  # Hypertension, patient is on lisinopril and hydrochlorothiazide  # Mild hypokalemia secondary to hydrochlorothiazide, potassium repleted.    Body mass index is 33.08 kg/m.  Interventions:  Diet: Regular diet DVT Prophylaxis: Subcutaneous Lovenox 30 every 12 hours  Advance goals of care discussion: Full code  Family Communication: No family at bedside this AM.  Plan discussed with patient and orthopedic PA McGhee  Disposition:  Pt is from Home, admitted with right knee osteoarthritis for a total knee replacement, Patient developed ST on 3/17 Asympmatic Workup unrevealing Suspect ST secondary to anxiety, pain, post-op constipation Recommend outpatient PCP and cardiology followup No specific further workup recommended from IM standpoint Discharge planning per primary orthopedic service  Subjective:  Patient was seen and examined at bedside, still patient is in sinus tachycardia on the monitor.  Denied any chest pain or palpitations.   Ortho note indicates small BM.  Patient states she still has not been able to defacate   Physical Exam: General:  alert oriented to time, place, and person.  Appear in no distress, affect appropriate Eyes: PERRLA ENT: Oral Mucosa Clear, moist  Neck: no JVD,  Cardiovascular: S1 and S2 Present, no Murmur,  Respiratory: good respiratory effort, Bilateral Air entry equal and Decreased, no Crackles, no wheezes Abdomen: Bowel Sound present, Soft and no tenderness,  Skin: no rasehs Extremities: no Pedal edema, no calf tenderness Neurologic: without any new focal findings Gait not checked due to patient safety concerns  Vitals:   01/29/20 1246 01/29/20 1535 01/29/20 2343 01/30/20 0801  BP: 118/79 107/68 118/75 130/82  Pulse: (!) 119 (!) 123 (!) 117 (!) 124  Resp:   20 16  Temp: 98.4 F (36.9 C) 99.2 F (37.3 C)  98.6  F (37 C)  TempSrc: Oral Oral    SpO2: 100% 100% 100% 99%  Weight:      Height:        Intake/Output Summary (Last 24 hours) at 01/30/2020 3845 Last data filed at 01/30/2020 0008 Gross per 24 hour  Intake 748.54 ml  Output 600 ml  Net 148.54 ml   Filed Weights   01/26/20 0636  Weight: 84.7 kg    Data Reviewed: I have personally reviewed and interpreted daily labs, tele strips, imagings as discussed above. I reviewed all nursing notes, pharmacy notes, vitals, pertinent old records I have discussed plan of care as described above with patient and primary service PA  CBC: Recent Labs  Lab 01/26/20 1436 01/27/20 0541 01/28/20 1112 01/29/20 0413 01/29/20 1827  WBC 15.4* 10.3 15.7* 11.8*  --   HGB 10.1* 8.7* 9.3* 7.9* 8.8*  HCT 30.7* 27.2* 28.4* 24.4* 26.3*  MCV 81.4 82.9 81.6 82.4  --   PLT 194 170 200 196  --    Basic Metabolic Panel: Recent Labs  Lab 01/26/20 0708 01/26/20 0708 01/26/20 1436 01/27/20 0541 01/28/20 1112 01/29/20 0413 01/30/20 0651  NA 137  --   --  140 134* 135 131*  K 3.2*  --   --  3.4* 4.3 3.6 3.7  CL 99  --   --  107 98 99 94*  CO2  --   --   --  25 27 27 26   GLUCOSE 83  --   --  * 139* 130* 116*  BUN 20  --   --  14 19 24* 21*  CREATININE 0.70   < > 0.71 0.53 0.75 0.81 0.70  CALCIUM  --   --   --  7.6* 9.0 8.6* 8.6*  MG  --   --   --   --   --  2.9* 2.4  PHOS  --   --   --   --   --  2.4* 3.2   < > = values in this interval not displayed.    Studies: ECHOCARDIOGRAM COMPLETE  Result Date: 01/29/2020    ECHOCARDIOGRAM REPORT   Patient Name:   Amanda Price Date of Exam: 01/29/2020 Medical Rec #:  01/31/2020   Height:       63.0 in Accession #:    [id number]  Weight:       186.7 lb Date of Birth:  Jun 22, 1966    BSA:          1.878 m Patient Age:    53 years    BP:           119/80 mmHg Patient Gender: F           HR:           126 bpm. Exam Location:  [facility] Procedure: 2D Echo, Cardiac Doppler and Color Doppler Indications:     Abnormal ECG  794.31  History:         Patient has no prior history of Echocardiogram examinations.                  Risk Factors:Hypertension.  Sonographer:     06/20/1966 RDCS (AE) Referring Phys:  Cristela Blue [id number] Diagnosing Phys: Gillis Santa MD IMPRESSIONS  1. Left ventricular ejection fraction, by estimation, is 65 to  70%. The left ventricle has normal function. Left ventricular endocardial border not optimally defined to evaluate regional wall motion. There is mild left ventricular hypertrophy. Indeterminate diastolic filling due to E-A fusion.  2. Right ventricular systolic function is normal. The right ventricular size is normal. Tricuspid regurgitation signal is inadequate for assessing PA pressure.  3. The mitral valve is grossly normal. Trivial mitral valve regurgitation. No evidence of mitral stenosis.  4. The aortic valve was not well visualized. Aortic valve regurgitation is not visualized. No aortic stenosis is present.  5. The inferior vena cava is normal in size with greater than 50% respiratory variability, suggesting right atrial pressure of 3 mmHg. FINDINGS  Left Ventricle: Left ventricular ejection fraction, by estimation, is 65 to 70%. The left ventricle has normal function. Left ventricular endocardial border not optimally defined to evaluate regional wall motion. The left ventricular internal cavity size was normal in size. There is mild left ventricular hypertrophy. Indeterminate diastolic filling due to E-A fusion. Right Ventricle: The right ventricular size is normal. No increase in right ventricular wall thickness. Right ventricular systolic function is normal. Tricuspid regurgitation signal is inadequate for assessing PA pressure. Left Atrium: Left atrial size was normal in size. Right Atrium: Right atrial size was normal in size. Pericardium: The pericardium was not well visualized. Mitral Valve: The mitral valve is grossly normal. Trivial mitral valve regurgitation. No evidence of mitral  valve stenosis. Tricuspid Valve: The tricuspid valve is not well visualized. Tricuspid valve regurgitation is trivial. Aortic Valve: The aortic valve was not well visualized. Aortic valve regurgitation is not visualized. No aortic stenosis is present. Aortic valve mean gradient measures 5.0 mmHg. Aortic valve peak gradient measures 8.4 mmHg. Aortic valve area, by VTI measures 3.59 cm. Pulmonic Valve: The pulmonic valve was not well visualized. Pulmonic valve regurgitation is not visualized. No evidence of pulmonic stenosis. Aorta: The aortic root is normal in size and structure. Pulmonary Artery: The pulmonary artery is not well seen. Venous: The inferior vena cava is normal in size with greater than 50% respiratory variability, suggesting right atrial pressure of 3 mmHg. IAS/Shunts: The interatrial septum was not well visualized.  LEFT VENTRICLE PLAX 2D LVIDd:         3.22 cm  Diastology LVIDs:         2.07 cm  LV e' lateral:   12.30 cm/s LV PW:         1.07 cm  LV E/e' lateral: 6.1 LV IVS:        0.77 cm  LV e' medial:    6.53 cm/s LVOT diam:     2.00 cm  LV E/e' medial:  11.5 LV SV:         89 LV SV Index:   47 LVOT Area:     3.14 cm  RIGHT VENTRICLE RV Basal diam:  2.64 cm RV S prime:     17.50 cm/s TAPSE (M-mode): 3.3 cm LEFT ATRIUM             Index       RIGHT ATRIUM           Index LA diam:        2.70 cm 1.44 cm/m  RA Area:     11.52 cm 6.14 cm/m LA Vol (A2C):   28.0 ml 14.91 ml/m RA Volume:   24.40 ml  12.99 ml/m LA Vol (A4C):   25.2 ml 13.42 ml/m LA Biplane Vol: 27.0 ml 14.38 ml/m  AORTIC VALVE  PULMONIC VALVE AV Area (Vmax):    2.66 cm     PV Vmax:        1.01 m/s AV Area (Vmean):   2.42 cm     PV Peak grad:   4.1 mmHg AV Area (VTI):     3.59 cm     RVOT Peak grad: 4 mmHg AV Vmax:           145.00 cm/s AV Vmean:          103.300 cm/s AV VTI:            0.247 m AV Peak Grad:      8.4 mmHg AV Mean Grad:      5.0 mmHg LVOT Vmax:         123.00 cm/s LVOT Vmean:        79.500  cm/s LVOT VTI:          0.282 m LVOT/AV VTI ratio: 1.14  AORTA Ao Root diam: 2.90 cm MITRAL VALVE MV Area (PHT): 5.54 cm     SHUNTS MV Decel Time: 137 msec     Systemic VTI:  0.28 m MV E velocity: 75.20 cm/s   Systemic Diam: 2.00 cm MV A velocity: 108.00 cm/s MV E/A ratio:  0.70 Cristal Deer End MD Electronically signed by Yvonne Kendall MD Signature Date/Time: 01/29/2020/3:39:26 PM    Final     Scheduled Meds: . bisacodyl  10 mg Oral BID  . bisacodyl  10 mg Rectal Once  . docusate sodium  100 mg Oral BID  . enoxaparin (LOVENOX) injection  30 mg Subcutaneous Q12H  . ferrous fumarate-b12-vitamic C-folic acid  1 capsule Oral BID  . losartan  100 mg Oral BH-q7a   And  . hydrochlorothiazide  25 mg Oral BH-q7a  . pantoprazole  40 mg Oral Daily  . polyethylene glycol  17 g Oral Daily  . traMADol  50 mg Oral Q6H   Continuous Infusions: . methocarbamol (ROBAXIN) IV     PRN Meds: acetaminophen, alum & mag hydroxide-simeth, bisacodyl, diphenhydrAMINE, HYDROmorphone (DILAUDID) injection, hydrOXYzine, magnesium citrate, magnesium hydroxide, menthol-cetylpyridinium **OR** phenol, methocarbamol **OR** methocarbamol (ROBAXIN) IV, metoCLOPramide **OR** metoCLOPramide (REGLAN) injection, ondansetron **OR** ondansetron (ZOFRAN) IV, oxyCODONE, oxyCODONE, zolpidem  Time spent: 25 minutes  Author: Lolita Patella MD [facility] 01/30/2020 9:42 AM  To reach On-call, see care teams to locate the attending and reach out to them via [email or web address]. If 7PM-7AM, please contact night-coverage If you still have difficulty reaching the attending provider, please page the [facility] (Director on Call) for [facility] on amion for assistance.

## 2020-01-30 NOTE — Progress Notes (Signed)
Discharge instructions provided. Medication prescriptions provided to patient. Follow up appointments reviewed. Knee dressing placed on patient per MD request. Transported by wheelchair to door where son picked up.

## 2020-02-11 ENCOUNTER — Other Ambulatory Visit
Admission: RE | Admit: 2020-02-11 | Discharge: 2020-02-11 | Disposition: A | Discharge status: Home / Self Care | Payer: 59 | Source: Ambulatory Visit | Attending: Internal Medicine | Admitting: Internal Medicine

## 2020-02-11 DIAGNOSIS — R000 Tachycardia, unspecified: Secondary | ICD-10-CM | POA: Diagnosis present

## 2020-02-11 LAB — BRAIN NATRIURETIC PEPTIDE: B Natriuretic Peptide: 25 pg/mL (ref 0.0–100.0)

## 2020-02-24 ENCOUNTER — Other Ambulatory Visit: Payer: Self-pay

## 2020-02-24 ENCOUNTER — Telehealth (INDEPENDENT_AMBULATORY_CARE_PROVIDER_SITE_OTHER): Payer: 59 | Admitting: Family

## 2020-02-24 ENCOUNTER — Encounter: Payer: Self-pay | Admitting: Family

## 2020-02-24 ENCOUNTER — Other Ambulatory Visit: Payer: Self-pay | Admitting: Orthopedic Surgery

## 2020-02-24 VITALS — Ht 63.0 in | Wt 185.0 lb

## 2020-02-24 DIAGNOSIS — I10 Essential (primary) hypertension: Secondary | ICD-10-CM | POA: Insufficient documentation

## 2020-02-24 DIAGNOSIS — D649 Anemia, unspecified: Secondary | ICD-10-CM

## 2020-02-24 DIAGNOSIS — E785 Hyperlipidemia, unspecified: Secondary | ICD-10-CM

## 2020-02-24 MED ORDER — LOSARTAN POTASSIUM-HCTZ 50-12.5 MG PO TABS: 1.0000 | ORAL_TABLET | Freq: Every day | ORAL | 3 refills | Status: DC

## 2020-02-24 NOTE — Progress Notes (Signed)
Virtual Visit via Video Note  I connected with@  on 02/24/20 at 10:30 AM EDT by a video enabled telemedicine application and verified that I am speaking with the correct person using two identifiers.  Location patient: home Location provider:work  Persons participating in the virtual visit: patient, provider  I discussed the limitations of evaluation and management by telemedicine and the availability of in person appointments. The patient expressed understanding and agreed to proceed.   HPI:  Feels well today. NO complaints.  HLD- LDL elevated. no diabetes, no family history of heart disease.   HTN-compliant with metoprolol 50mg .  Losartan hydrochlorothiazide has been decreased after potassium returned low from Dr. Clayborn Bigness.  She is currently on losartan-hydrochlorothiazide 50-12 .5mg .She had taking Kcl BID however no refills from Dr Clayborn Bigness.  121/79 to 117/ 80. Denies exertional chest pain or pressure, numbness or tingling radiating to left arm or jaw, palpitations, dizziness, frequent headaches, changes in vision, or shortness of breath.   Dr Clayborn Bigness 02/11/2020  Anemia post operatively- upcoming appointment with GI this month. S/p blood transfusion after R TKR.  Hemoglobin 9.9 02/11/20 , prior had been 7.9 prior to transfusion. No menses, blood in stool. Normal Hemoglobin and  Hematocrit normal.  Recent right knee replacement 01/26/20, Dr Collene Mares. Left 4th and finger are numb. No numbness it arm. No pain in left shoulder or neck. Able to open and close fingers and hands without pain. She discussed this with Dr Clayborn Bigness and has referral to neurology.   Works on Forensic scientist.   ROS: See pertinent positives and negatives per HPI.  Past Medical History:  Diagnosis Date  . Anemia   . Arthritis   . Frequent headaches   . H/O breast biopsy    Left Breast  . Hypertension     Past Surgical History:  Procedure Laterality Date  . BREAST BIOPSY Left    needle per pt  . BREAST SURGERY      Breast biopsy  . COLONOSCOPY N/A 09/26/2015   Procedure: COLONOSCOPY;  Surgeon: Lollie Sails, MD;  Location: [facility] ENDOSCOPY;  Service: Endoscopy;  Laterality: N/A;  . FINGER SURGERY    . INTRAUTERINE DEVICE (IUD) INSERTION  04/2014  . KNEE SURGERY Right   . TOTAL KNEE ARTHROPLASTY Right 01/26/2020   Procedure: TOTAL KNEE ARTHROPLASTY;  Surgeon: Hessie Knows, MD;  Location: [facility] ORS;  Service: Orthopedics;  Laterality: Right;    Family History  Problem Relation Age of Onset  . Healthy Mother   . Healthy Father   . Hypertension Sister   . Diabetes Sister   . Hypertension Sister   . Diabetes Sister   . Hypertension Sister   . Diabetes Sister   . Breast cancer Neg Hx      Current Outpatient Medications:  .  metoprolol succinate (TOPROL-XL) 50 MG 24 hr tablet, Take 1 tablet by mouth daily., Disp: , Rfl:  .  oxyCODONE (OXY IR/ROXICODONE) 5 MG immediate release tablet, Take 1-2 tablets (5-10 mg total) by mouth every 4 (four) hours as needed for moderate pain (pain score 4-6)., Disp: 40 tablet, Rfl: 0 .  losartan-hydrochlorothiazide (HYZAAR) 50-12.5 MG tablet, Take 1 tablet by mouth daily., Disp: 90 tablet, Rfl: 3  EXAM:  VITALS per patient if applicable:  GENERAL: alert, oriented, appears well and in no acute distress  HEENT: atraumatic, conjunttiva clear, no obvious abnormalities on inspection of external nose and ears  NECK: normal movements of the head and neck  LUNGS: on inspection no signs of respiratory distress,  breathing rate appears normal, no obvious gross SOB, gasping or wheezing  CV: no obvious cyanosis  MS: moves all visible extremities without noticeable abnormality  PSYCH/NEURO: pleasant and cooperative, no obvious depression or anxiety, speech and thought processing grossly intact  ASSESSMENT AND PLAN:  Discussed the following assessment and plan:  Essential hypertension - Plan: losartan-hydrochlorothiazide (HYZAAR) 50-12.5 MG tablet  Hyperlipidemia,  unspecified hyperlipidemia type  Anemia, unspecified type Problem List Items Addressed This Visit      Cardiovascular and Mediastinum   Hypertension - Primary    Controlled.  Recent decrease of losartan hydrochlorothiazide, likely potassium normalized.  Advised at this time to recheck BMP on new dose of losartan- hydrochlorothiazide 50-12.mg, and stop potassium chloride and see if potassium is normalized.  Will make adjustments regards to potassium after labs return       Relevant Medications   metoprolol succinate (TOPROL-XL) 50 MG 24 hr tablet   losartan-hydrochlorothiazide (HYZAAR) 50-12.5 MG tablet     Other   Anemia    Suspect more likely postoperatively as there was a normal hemoglobin hematocrit days prior to surgery.  Patient has upcoming gastroenterology appointment this month which I very much encouraged her to keep.  She will follow-up with me in a couple months so we can monitor closely      HLD (hyperlipidemia)    Estimated 10-year ASCVD risk 3 %.  Patient has no first-degree relative family history CV and no atherosclerotic disease that I can see on recent imaging.  Discussed her that her LDL is elevated Advised of dietary changes. She politely declines starting a statin medication at this time.  Emphasized the importance of continued close follow-up of her cholesterol as her risk may change in the future I may strongly advise statin medication.  Patient verbalized understanding of all      Relevant Medications   metoprolol succinate (TOPROL-XL) 50 MG 24 hr tablet   losartan-hydrochlorothiazide (HYZAAR) 50-12.5 MG tablet      -we discussed possible serious and likely etiologies, options for evaluation and workup, limitations of telemedicine visit vs in person visit, treatment, treatment risks and precautions. Pt prefers to treat via telemedicine empirically rather then risking or undertaking an in person visit at this moment. Patient agrees to seek prompt in person care if  worsening, new symptoms arise, or if is not improving with treatment.   I discussed the assessment and treatment plan with the patient. The patient was provided an opportunity to ask questions and all were answered. The patient agreed with the plan and demonstrated an understanding of the instructions.   The patient was advised to call back or seek an in-person evaluation if the symptoms worsen or if the condition fails to improve as anticipated.   Rennie Plowman, FNP

## 2020-02-24 NOTE — Assessment & Plan Note (Signed)
Estimated 10-year ASCVD risk 3 %.  Patient has no first-degree relative family history CV and no atherosclerotic disease that I can see on recent imaging.  Discussed her that her LDL is elevated Advised of dietary changes. She politely declines starting a statin medication at this time.  Emphasized the importance of continued close follow-up of her cholesterol as her risk may change in the future I may strongly advise statin medication.  Patient verbalized understanding of all

## 2020-02-24 NOTE — Assessment & Plan Note (Signed)
Suspect more likely postoperatively as there was a normal hemoglobin hematocrit days prior to surgery.  Patient has upcoming gastroenterology appointment this month which I very much encouraged her to keep.  She will follow-up with me in a couple months so we can monitor closely

## 2020-02-24 NOTE — Assessment & Plan Note (Signed)
Controlled.  Recent decrease of losartan hydrochlorothiazide, likely potassium normalized.  Advised at this time to recheck BMP on new dose of losartan- hydrochlorothiazide 50-12.mg, and stop potassium chloride and see if potassium is normalized.  Will make adjustments regards to potassium after labs return

## 2020-02-25 ENCOUNTER — Telehealth: Payer: Self-pay | Admitting: Family

## 2020-02-25 ENCOUNTER — Inpatient Hospital Stay: Admission: RE | Admit: 2020-02-25 | Payer: 59 | Source: Ambulatory Visit

## 2020-02-25 NOTE — Telephone Encounter (Signed)
Pt will call back about lab appointment being scheduled this week or next week according to check out notes from [place].

## 2020-02-26 ENCOUNTER — Encounter
Admission: RE | Admit: 2020-02-26 | Discharge: 2020-02-26 | Disposition: A | Discharge status: Home / Self Care | Payer: 59 | Source: Ambulatory Visit | Attending: Orthopedic Surgery | Admitting: Orthopedic Surgery

## 2020-02-26 ENCOUNTER — Other Ambulatory Visit: Payer: Self-pay

## 2020-02-26 ENCOUNTER — Other Ambulatory Visit
Admission: RE | Admit: 2020-02-26 | Discharge: 2020-02-26 | Disposition: A | Discharge status: Home / Self Care | Payer: 59 | Source: Ambulatory Visit | Attending: Orthopedic Surgery | Admitting: Orthopedic Surgery

## 2020-02-26 DIAGNOSIS — Z01812 Encounter for preprocedural laboratory examination: Secondary | ICD-10-CM | POA: Insufficient documentation

## 2020-02-26 DIAGNOSIS — Z20822 Contact with and (suspected) exposure to covid-19: Secondary | ICD-10-CM | POA: Insufficient documentation

## 2020-02-26 LAB — HEMOGLOBIN AND HEMATOCRIT, BLOOD
HCT: 33.9 % — ABNORMAL LOW (ref 36.0–46.0)
Hemoglobin: 10.9 g/dL — ABNORMAL LOW (ref 12.0–15.0)

## 2020-02-26 LAB — POTASSIUM: Potassium: 3.5 mmol/L (ref 3.5–5.1)

## 2020-02-26 LAB — SARS CORONAVIRUS 2 (TAT 6-24 HRS): SARS Coronavirus 2: NEGATIVE

## 2020-02-26 NOTE — Pre-Procedure Instructions (Signed)
mOST RECENT LABS 02/11/20 NOTED IN [facility]ARE [facility]

## 2020-02-26 NOTE — Patient Instructions (Signed)
Your procedure is scheduled on: 03/01/20 Report to [facility]. To find out your arrival time please call [phone] between 1PM - 3PM on 02/29/20.  Remember: Instructions that are not followed completely may result in serious medical risk, up to and including death, or upon the discretion of your surgeon and anesthesiologist your surgery may need to be rescheduled.     _X__ 1. Do not eat food after midnight the night before your procedure.                 No gum chewing or hard candies. You may drink clear liquids up to 2 hours                 before you are scheduled to arrive for your surgery- DO not drink clear                 liquids within 2 hours of the start of your surgery.                 Clear Liquids include:  water, apple juice without pulp, clear carbohydrate                 drink such as Clearfast or Gatorade, Black Coffee or Tea (Do not add                 anything to coffee or tea). Diabetics water only  __X__2.  On the morning of surgery brush your teeth with toothpaste and water, you                 may rinse your mouth with mouthwash if you wish.  Do not swallow any              toothpaste of mouthwash.     _X__ 3.  No Alcohol for 24 hours before or after surgery.   _X__ 4.  Do Not Smoke or use e-cigarettes For 24 Hours Prior to Your Surgery.                 Do not use any chewable tobacco products for at least 6 hours prior to                 surgery.  ____  5.  Bring all medications with you on the day of surgery if instructed.   __X__  6.  Notify your doctor if there is any change in your medical condition      (cold, fever, infections).     Do not wear jewelry, make-up, hairpins, clips or nail polish. Do not wear lotions, powders, or perfumes.  Do not shave 48 hours prior to surgery. Men may shave face and neck. Do not bring valuables to the hospital.    [facility] is not responsible for any belongings or  valuables.  Contacts, dentures/partials or body piercings may not be worn into surgery. Bring a case for your contacts, glasses or hearing aids, a denture cup will be supplied. Leave your suitcase in the car. After surgery it may be brought to your room. For patients admitted to the hospital, discharge time is determined by your treatment team.   Patients discharged the day of surgery will not be allowed to drive home.   Please read over the following fact sheets that you were given:   MRSA Information  __X__ Take these medicines the morning of surgery with A SIP OF WATER:  1. metoprolol succinate (TOPROL-XL) 50 MG 24 hr tablet  2. oxyCODONE (OXY IR/ROXICODONE) 5 MG immediate release tablet IF NEEDED  3.   4.  5.  6.  ____ Fleet Enema (as directed)   ____ Use CHG Soap/SAGE wipes as directed  ____ Use inhalers on the day of surgery  ____ Stop metformin/Janumet/Farxiga 2 days prior to surgery    ____ Take 1/2 of usual insulin dose the night before surgery. No insulin the morning          of surgery.   ____ Stop Blood Thinners Coumadin/Plavix/Xarelto/Pleta/Pradaxa/Eliquis/Effient/Aspirin  on   Or contact your Surgeon, Cardiologist or Medical Doctor regarding  ability to stop your blood thinners  __X__ Stop Anti-inflammatories 7 days before surgery such as Advil, Ibuprofen, Motrin,  BC or Goodies Powder, Naprosyn, Naproxen, Aleve, Aspirin    __X__ Stop all herbal supplements, fish oil or vitamin E until after surgery.    ____ Bring C-Pap to the hospital.

## 2020-03-01 ENCOUNTER — Encounter: Payer: Self-pay | Admitting: Orthopedic Surgery

## 2020-03-01 ENCOUNTER — Other Ambulatory Visit: Payer: Self-pay

## 2020-03-01 ENCOUNTER — Ambulatory Visit
Admission: RE | Admit: 2020-03-01 | Discharge: 2020-03-01 | Disposition: A | Discharge status: Home / Self Care | Payer: 59 | Source: Ambulatory Visit | Attending: Orthopedic Surgery | Admitting: Orthopedic Surgery

## 2020-03-01 ENCOUNTER — Encounter
Admission: RE | Disposition: A | Discharge status: Home / Self Care | Payer: Self-pay | Source: Ambulatory Visit | Attending: Orthopedic Surgery

## 2020-03-01 ENCOUNTER — Ambulatory Visit: Payer: 59 | Admitting: Anesthesiology

## 2020-03-01 DIAGNOSIS — G8929 Other chronic pain: Secondary | ICD-10-CM | POA: Diagnosis not present

## 2020-03-01 DIAGNOSIS — Z79899 Other long term (current) drug therapy: Secondary | ICD-10-CM | POA: Insufficient documentation

## 2020-03-01 DIAGNOSIS — M25661 Stiffness of right knee, not elsewhere classified: Secondary | ICD-10-CM | POA: Diagnosis not present

## 2020-03-01 DIAGNOSIS — R200 Anesthesia of skin: Secondary | ICD-10-CM | POA: Insufficient documentation

## 2020-03-01 DIAGNOSIS — I10 Essential (primary) hypertension: Secondary | ICD-10-CM | POA: Insufficient documentation

## 2020-03-01 DIAGNOSIS — R000 Tachycardia, unspecified: Secondary | ICD-10-CM | POA: Insufficient documentation

## 2020-03-01 DIAGNOSIS — Z96651 Presence of right artificial knee joint: Secondary | ICD-10-CM | POA: Diagnosis not present

## 2020-03-01 DIAGNOSIS — M25561 Pain in right knee: Secondary | ICD-10-CM | POA: Diagnosis not present

## 2020-03-01 HISTORY — PX: KNEE CLOSED REDUCTION: SHX995

## 2020-03-01 LAB — POCT PREGNANCY, URINE: Preg Test, Ur: NEGATIVE

## 2020-03-01 SURGERY — MANIPULATION, KNEE, CLOSED
Anesthesia: General | Site: Knee | Laterality: Right

## 2020-03-01 MED ORDER — MIDAZOLAM HCL 2 MG/2ML IJ SOLN
INTRAMUSCULAR | Status: DC | PRN
  Administered 2020-03-01: 2 mg via INTRAVENOUS

## 2020-03-01 MED ORDER — LACTATED RINGERS IV SOLN: INTRAVENOUS | Status: DC

## 2020-03-01 MED ORDER — METOCLOPRAMIDE HCL 10 MG PO TABS: 5.0000 mg | ORAL_TABLET | Freq: Three times a day (TID) | ORAL | Status: DC | PRN

## 2020-03-01 MED ORDER — OXYCODONE HCL 5 MG/5ML PO SOLN: 5.0000 mg | Freq: Once | ORAL | Status: AC | PRN

## 2020-03-01 MED ORDER — FAMOTIDINE 20 MG PO TABS
ORAL_TABLET | ORAL | Status: AC
  Administered 2020-03-01: 20 mg via ORAL
  Filled 2020-03-01: qty 1

## 2020-03-01 MED ORDER — ONDANSETRON HCL 4 MG/2ML IJ SOLN: 4.0000 mg | Freq: Four times a day (QID) | INTRAMUSCULAR | Status: DC | PRN

## 2020-03-01 MED ORDER — MIDAZOLAM HCL 2 MG/2ML IJ SOLN
INTRAMUSCULAR | Status: AC
  Filled 2020-03-01: qty 2

## 2020-03-01 MED ORDER — OXYCODONE HCL 5 MG PO TABS
5.0000 mg | ORAL_TABLET | Freq: Once | ORAL | Status: AC | PRN
  Administered 2020-03-01: 5 mg via ORAL

## 2020-03-01 MED ORDER — LIDOCAINE HCL (PF) 2 % IJ SOLN
INTRAMUSCULAR | Status: AC
  Filled 2020-03-01: qty 5

## 2020-03-01 MED ORDER — SODIUM CHLORIDE 0.9 % IV SOLN: INTRAVENOUS | Status: DC

## 2020-03-01 MED ORDER — FENTANYL CITRATE (PF) 100 MCG/2ML IJ SOLN
INTRAMUSCULAR | Status: AC
  Filled 2020-03-01: qty 2

## 2020-03-01 MED ORDER — OXYCODONE HCL 5 MG PO TABS: 5.0000 mg | ORAL_TABLET | ORAL | 0 refills | Status: DC | PRN

## 2020-03-01 MED ORDER — METOCLOPRAMIDE HCL 5 MG/ML IJ SOLN: 5.0000 mg | Freq: Three times a day (TID) | INTRAMUSCULAR | Status: DC | PRN

## 2020-03-01 MED ORDER — LIDOCAINE HCL (CARDIAC) PF 100 MG/5ML IV SOSY
PREFILLED_SYRINGE | INTRAVENOUS | Status: DC | PRN
  Administered 2020-03-01: 100 mg via INTRATRACHEAL

## 2020-03-01 MED ORDER — PROPOFOL 10 MG/ML IV BOLUS
INTRAVENOUS | Status: AC
  Filled 2020-03-01: qty 20

## 2020-03-01 MED ORDER — PROPOFOL 10 MG/ML IV BOLUS
INTRAVENOUS | Status: DC | PRN
  Administered 2020-03-01: 30 mg via INTRAVENOUS
  Administered 2020-03-01: 120 mg via INTRAVENOUS

## 2020-03-01 MED ORDER — FAMOTIDINE 20 MG PO TABS: 20.0000 mg | ORAL_TABLET | Freq: Once | ORAL | Status: AC

## 2020-03-01 MED ORDER — OXYCODONE HCL 5 MG PO TABS
ORAL_TABLET | ORAL | Status: AC
  Filled 2020-03-01: qty 1

## 2020-03-01 MED ORDER — ONDANSETRON HCL 4 MG PO TABS: 4.0000 mg | ORAL_TABLET | Freq: Four times a day (QID) | ORAL | Status: DC | PRN

## 2020-03-01 MED ORDER — FENTANYL CITRATE (PF) 100 MCG/2ML IJ SOLN
25.0000 ug | INTRAMUSCULAR | Status: DC | PRN
  Administered 2020-03-01 (×4): 50 ug via INTRAVENOUS

## 2020-03-01 SURGICAL SUPPLY — 1 items: KIT TURNOVER KIT A (KITS) ×2 IMPLANT

## 2020-03-01 NOTE — Progress Notes (Signed)
Encouraged patient to close her eyes and relax, allow the pain medications to work.  Patient verbalized understanding.

## 2020-03-01 NOTE — Progress Notes (Signed)
On pacu arrival, patient in obvious pain, moaning and moving all over the stetcher.

## 2020-03-01 NOTE — H&P (Signed)
Chief Complaint  Patient presents with  . Post Operative Visit  Knee Mobility/ROM following Rt TKA   History of the Present Illness: Amanda Price is a 54 y.o. female here for evaluation, who had a previous right total knee arthroplasty on 01/26/2020, and has developed a stiff knee. The patient presents today to discuss right knee manipulation, as she has had very poor motion, only 30 degrees with PT today.   The patient has had heart issues for which she takes a tachycardia medication, and she saw Dr. Clayborn Bigness who referred her to neurologist for left hand numbness.   I have reviewed past medical, surgical, social and family history, and allergies as documented in the EMR.  Past Medical History: Past Medical History:  Diagnosis Date  . Hypertension   Past Surgical History: Past Surgical History:  Procedure Laterality Date  . BREAST EXCISIONAL BIOPSY  . COLONOSCOPY 09/26/2015  Entire examined colon is normal/Repeat 63yrs/MUS  . JOINT REPLACEMENT  . knee surgery Right  . REPLACEMENT TOTAL KNEE Right   Past Family History: No family history on file.  Medications: Current Outpatient Medications Ordered in Epic  Medication Sig Dispense Refill  . acetaminophen (TYLENOL) 500 MG tablet Take by mouth  . losartan-hydrochlorothiazide (HYZAAR) 100-25 mg tablet Take 0.5 tablets by mouth once daily  . methocarbamoL (ROBAXIN) 500 MG tablet Take 1 tablet (500 mg total) by mouth 4 (four) times daily 60 tablet 0  . metoprolol succinate (TOPROL-XL) 50 MG XL tablet Take 1 tablet (50 mg total) by mouth once daily 30 tablet 3  . oxyCODONE (ROXICODONE) 5 MG immediate release tablet Take 1-2 tablets (5-10 mg total) by mouth every 4 (four) hours as needed for Pain 40 tablet 0  . potassium chloride (KLOR-CON) 10 mEq ER tablet Take 1 tablet (10 mEq total) by mouth 2 (two) times daily 20 tablet 0  . traMADoL (ULTRAM) 50 mg tablet Take 1 tablet (50 mg total) by mouth every 6 (six) hours as needed for Pain 30  tablet 0  . XOLEGEL 2 % Gel   No current Epic-ordered facility-administered medications on file.   Allergies: No Known Allergies   Body mass index is 32.42 kg/m.  Review of Systems: A comprehensive 14 point ROS was performed, reviewed, and the pertinent orthopaedic findings are documented in the HPI.  Vitals:  02/17/20 1328  BP: 128/76   General Physical Examination:  General/Constitutional: No apparent distress: well-nourished and well developed. Eyes: Pupils equal, round with synchronous movement. Lungs: Clear to auscultation HEENT: Normal Vascular: No edema, swelling or tenderness, except as noted in detailed exam. Cardiac: Heart rate and rhythm is regular. Integumentary: No impressive skin lesions present, except as noted in detailed exam. Neuro/Psych: Normal mood and affect, oriented to person, place and time.  Musculoskeletal Examination: On exam, left hand has good strength. Right knee has poor range of motion. Slow gait.   Radiographs: No new imaging studies were obtained or reviewed today.  Assessment: ICD-10-CM  1. Primary osteoarthritis of right knee M17.11  2. 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   I recommend a manipulation; however, the patient does not want to have that procedure. I gave her exercises to do to help stretch her right knee.   I advised that the patient should get an EMG nerve conduction study for her left hand. I encouraged her to massage her left hand to desensitize it, and avoid  bending her left arm at the elbow.   The patient will return in 1-1/2 weeks. If she is not flexing her right knee to 90 degrees at that time, we will discuss manipulation.   Scribe Attestation: I, Dawn Royse, am acting as scribe for [employer], MD    Electronically signed by Marlena Clipper, MD at 02/18/2020 10:31 PM EDT   Reviewed paper  H+P, will be scanned into chart. No changes noted.

## 2020-03-01 NOTE — Anesthesia Postprocedure Evaluation (Signed)
Anesthesia Post Note  Patient: Lilu Biermann  Procedure(s) Performed: KNEE MANIPULATION UNDER ANESTHESIA (Right Knee)  Patient location during evaluation: PACU Anesthesia Type: General Level of consciousness: awake and alert Pain management: pain level controlled Vital Signs Assessment: post-procedure vital signs reviewed and stable Respiratory status: spontaneous breathing, nonlabored ventilation, respiratory function stable and patient connected to nasal cannula oxygen Cardiovascular status: blood pressure returned to baseline and stable Postop Assessment: no apparent nausea or vomiting Anesthetic complications: no     Last Vitals:  Vitals:   03/01/20 1118 03/01/20 1141  BP: (!) 152/71 (!) 133/98  Pulse: 96   Resp: 16 16  Temp: 37.5 C   SpO2: 100% 100%    Last Pain:  Vitals:   03/01/20 1139  TempSrc:   PainSc: 4                  Zeferino Mounts K Nishita Isaacks

## 2020-03-01 NOTE — Progress Notes (Signed)
Patient wants to remove pillow from under her right knee.  States it makes her have more pain and is not comfortable. Removed for patients comfort.

## 2020-03-01 NOTE — Anesthesia Preprocedure Evaluation (Addendum)
Anesthesia Evaluation  Patient identified by MRN, date of birth, ID band Patient awake    Reviewed: Allergy & Precautions, H&P , NPO status , Patient's Chart, lab work & pertinent test results, reviewed documented beta blocker date and time   History of Anesthesia Complications Negative for: history of anesthetic complications  Airway Mallampati: II  TM Distance: >3 FB Neck ROM: limited    Dental  (+) Partial Lower, Missing, Dental Advidsory Given, Poor Dentition   Pulmonary neg pulmonary ROS, neg shortness of breath,    Pulmonary exam normal breath sounds clear to auscultation       Cardiovascular Exercise Tolerance: Good hypertension, (-) angina(-) Past MI and (-) Cardiac Stents (-) dysrhythmias (-) Valvular Problems/Murmurs Rhythm:regular Rate:Normal     Neuro/Psych  Headaches, negative psych ROS   GI/Hepatic negative GI ROS, Neg liver ROS,   Endo/Other  negative endocrine ROS  Renal/GU negative Renal ROS  negative genitourinary   Musculoskeletal  (+) Arthritis ,   Abdominal   Peds  Hematology negative hematology ROS (+)   Anesthesia Other Findings Past Medical History: No date: Anemia No date: Arthritis No date: Frequent headaches No date: H/O breast biopsy     Comment:  Left Breast No date: Hypertension   Reproductive/Obstetrics negative OB ROS                             Anesthesia Physical  Anesthesia Plan  ASA: II  Anesthesia Plan: General   Post-op Pain Management:    Induction: Intravenous  PONV Risk Score and Plan: 2 and Propofol infusion and TIVA  Airway Management Planned: Natural Airway and Nasal Cannula  Additional Equipment:   Intra-op Plan:   Post-operative Plan:   Informed Consent: I have reviewed the patients History and Physical, chart, labs and discussed the procedure including the risks, benefits and alternatives for the proposed anesthesia with  the patient or authorized representative who has indicated his/her understanding and acceptance.     Dental Advisory Given  Plan Discussed with: CRNA  Anesthesia Plan Comments: (Patient consented for risks of anesthesia including but not limited to:  - adverse reactions to medications - risk of intubation if required - damage to eyes, teeth, lips or other oral mucosa - nerve damage due to positioning  - sore throat or hoarseness - Damage to heart, brain, nerves, lungs or loss of life  Patient voiced understanding.)        Anesthesia Quick Evaluation

## 2020-03-01 NOTE — Discharge Instructions (Addendum)
Work hard starting today and getting the knee to bend back.  We are able to get you from 0 to 110 degrees.  Work with physical therapy starting tomorrow. Pain medicine as directed.  You can use the Polar Care that was used after surgery to help with swelling and pain after this procedure.   AMBULATORY SURGERY  DISCHARGE INSTRUCTIONS   1) The drugs that you were given will stay in your system until tomorrow so for the next 24 hours you should not:  A) Drive an automobile B) Make any legal decisions C) Drink any alcoholic beverage   2) You may resume regular meals tomorrow.  Today it is better to start with liquids and gradually work up to solid foods.  You may eat anything you prefer, but it is better to start with liquids, then soup and crackers, and gradually work up to solid foods.   3) Please notify your doctor immediately if you have any unusual bleeding, trouble breathing, redness and pain at the surgery site, drainage, fever, or pain not relieved by medication.    4) Additional Instructions:        Please contact your physician with any problems or Same Day Surgery at [phone], Monday through Friday 6 am to 4 pm, or [place] at [facility] number at [phone].  Oxycodone 5mg  taken 03/01/20 at 10:20am. Next dose if needed no sooner than 2:20pm

## 2020-03-01 NOTE — Op Note (Signed)
03/01/2020  10:32 AM  PATIENT:  Amanda Price  54 y.o. female  PRE-OPERATIVE DIAGNOSIS:  Status post total right knee replacement Chronic pain of right knee  Stiffness of right knee  POST-OPERATIVE DIAGNOSIS:  Status post total right knee replacement Chronic pain of right knee  Stiffness of right knee  PROCEDURE:  Procedure(s): KNEE MANIPULATION UNDER ANESTHESIA (Right)  SURGEON: Leitha Schuller, MD  ASSISTANTS: None  ANESTHESIA:   general  EBL:  Total I/O In: 200 [I.V.:200] Out: -   BLOOD ADMINISTERED:none  DRAINS: none   LOCAL MEDICATIONS USED:  NONE  SPECIMEN:  No Specimen  DISPOSITION OF SPECIMEN:  N/A  COUNTS:  NO Closed procedure no count required  TOURNIQUET:  * No tourniquets in log *  IMPLANTS: None  DICTATION: .Dragon Dictation patient was brought to the operating room and after adequate anesthesia was obtained appropriate patient identification and timeout procedure were completed.  At the start of the case range of motion was approximately 5degrees to 25 degrees.  With the hip in flexion the leg was brought up into flexion and gentle pressure applied to the proximal tibia.  There is audible popping of adhesions in flexion can be brought back to 110 degrees.  Going on extension full extension was also obtained.  Patient tolerated procedure well  PLAN OF CARE: Discharge to home after PACU  PATIENT DISPOSITION:  PACU - hemodynamically stable.

## 2020-03-01 NOTE — Transfer of Care (Signed)
Immediate Anesthesia Transfer of Care Note  Patient: Amanda Price  Procedure(s) Performed: KNEE MANIPULATION UNDER ANESTHESIA (Right Knee)  Patient Location: PACU  Anesthesia Type:General  Level of Consciousness: awake, alert  and oriented  Airway & Oxygen Therapy: Patient Spontanous Breathing and Patient connected to face mask oxygen  Post-op Assessment: Report given to RN and Post -op Vital signs reviewed and stable  Post vital signs: Reviewed and stable  Last Vitals:  Vitals Value Taken Time  BP 147/98 03/01/20 0958  Temp    Pulse 31 03/01/20 0957  Resp 18 03/01/20 0959  SpO2 87 % 03/01/20 0957  Vitals shown include unvalidated device data.  Last Pain:  Vitals:   03/01/20 0904  TempSrc: Temporal  PainSc: 7          Complications: No apparent anesthesia complications

## 2020-03-10 ENCOUNTER — Other Ambulatory Visit: Payer: Self-pay | Admitting: Dermatology

## 2020-03-28 ENCOUNTER — Telehealth: Payer: Self-pay

## 2020-03-28 MED ORDER — KETOCONAZOLE 2 % EX CREA: 1.0000 "application " | TOPICAL_CREAM | Freq: Two times a day (BID) | CUTANEOUS | 1 refills | Status: AC

## 2020-03-28 NOTE — Telephone Encounter (Signed)
Xolegel not covered by [employer]. Will switch to Ketoconazole 2% cream instead. LM on VM for patient to return my call.

## 2020-03-30 DIAGNOSIS — R202 Paresthesia of skin: Secondary | ICD-10-CM | POA: Insufficient documentation

## 2020-03-30 DIAGNOSIS — R200 Anesthesia of skin: Secondary | ICD-10-CM | POA: Insufficient documentation

## 2020-05-04 ENCOUNTER — Other Ambulatory Visit: Payer: Self-pay | Admitting: Orthopedic Surgery

## 2020-05-06 ENCOUNTER — Encounter
Admission: RE | Admit: 2020-05-06 | Discharge: 2020-05-06 | Disposition: A | Discharge status: Home / Self Care | Payer: 59 | Source: Ambulatory Visit | Attending: Orthopedic Surgery | Admitting: Orthopedic Surgery

## 2020-05-06 ENCOUNTER — Other Ambulatory Visit: Payer: Self-pay

## 2020-05-06 NOTE — Patient Instructions (Addendum)
Your procedure is scheduled on: Tuesday May 10, 2020 Report to Day Surgery. To find out your arrival time please call [phone] between 1PM - 3PM on Monday May 09, 2020.  Remember: Instructions that are not followed completely may result in serious medical risk,  up to and including death, or upon the discretion of your surgeon and anesthesiologist your  surgery may need to be rescheduled.     _X__ 1. Do not eat food after midnight the night before your procedure.                 No gum chewing or hard candies. You may drink clear liquids up to 2 hours                 before you are scheduled to arrive for your surgery- DO not drink clear                 liquids within 2 hours of the start of your surgery.                 Clear Liquids include:  water, apple juice without pulp, clear Gatorade, G2 or                  Gatorade Zero (avoid Red/Purple/Blue), Black Coffee or Tea (Do not add                 anything to coffee or tea).  __X__2. Complete the carbohydrate drink provided to you, 2 hours before arrival.  __X__3.  On the morning of surgery brush your teeth with toothpaste and water, you                may rinse your mouth with mouthwash if you wish.  Do not swallow any toothpaste of mouthwash.     _X__ 4.  No Alcohol for 24 hours before or after surgery.   _X__ 5.  Do Not Smoke or use e-cigarettes For 24 Hours Prior to Your Surgery.                 Do not use any chewable tobacco products for at least 6 hours prior to                 Surgery.  _X__  6.  Do not use any recreational drugs (marijuana, cocaine, heroin, ecstacy, MDMA or other)                For at least one week prior to your surgery.  Combination of these drugs with anesthesia                May have life threatening results.  __X__ 7.  Notify your doctor if there is any change in your medical condition      (cold, fever, infections).     Do not wear jewelry, make-up, hairpins, clips  or nail polish. Do not wear lotions, powders, or perfumes. You may wear deodorant. Do not shave 48 hours prior to surgery. Men may shave face and neck. Do not bring valuables to the hospital.    [facility] is not responsible for any belongings or valuables.  Contacts, dentures or bridgework may not be worn into surgery. Leave your suitcase in the car. After surgery it may be brought to your room. For patients admitted to the hospital, discharge time is determined by your treatment team.   Patients discharged the day of surgery will not be allowed to drive home.  Make arrangements for someone to be with you for the first 24 hours of your Same Day Discharge.   __X__ Take these medicines the morning of surgery with A SIP OF WATER:    1. metoprolol succinate (TOPROL-XL)   __X__ Use CHG Soap (or wipes) as directed  __X__ Stop Anti-inflammatories such as ibuprofen, Aleve, Advil, naproxen, aspirin and or BC Powders.    __X__ Stop supplements until after surgery.    __X__ Do not start any herbal supplements before your surgery.

## 2020-05-09 ENCOUNTER — Encounter: Payer: Self-pay | Admitting: Urgent Care

## 2020-05-09 ENCOUNTER — Other Ambulatory Visit: Payer: Self-pay

## 2020-05-09 ENCOUNTER — Other Ambulatory Visit
Admission: RE | Admit: 2020-05-09 | Discharge: 2020-05-09 | Disposition: A | Discharge status: Home / Self Care | Payer: 59 | Source: Ambulatory Visit | Attending: Orthopedic Surgery | Admitting: Orthopedic Surgery

## 2020-05-09 DIAGNOSIS — Z01812 Encounter for preprocedural laboratory examination: Secondary | ICD-10-CM | POA: Diagnosis not present

## 2020-05-09 DIAGNOSIS — Z20822 Contact with and (suspected) exposure to covid-19: Secondary | ICD-10-CM | POA: Insufficient documentation

## 2020-05-09 LAB — CBC
HCT: 37.8 % (ref 36.0–46.0)
Hemoglobin: 12.4 g/dL (ref 12.0–15.0)
MCH: 26.7 pg (ref 26.0–34.0)
MCHC: 32.8 g/dL (ref 30.0–36.0)
MCV: 81.5 fL (ref 80.0–100.0)
Platelets: 241 10*3/uL (ref 150–400)
RBC: 4.64 MIL/uL (ref 3.87–5.11)
RDW: 14.6 % (ref 11.5–15.5)
WBC: 5.1 10*3/uL (ref 4.0–10.5)
nRBC: 0 % (ref 0.0–0.2)

## 2020-05-09 LAB — BASIC METABOLIC PANEL
Anion gap: 12 (ref 5–15)
BUN: 15 mg/dL (ref 6–20)
CO2: 26 mmol/L (ref 22–32)
Calcium: 9.7 mg/dL (ref 8.9–10.3)
Chloride: 100 mmol/L (ref 98–111)
Creatinine, Ser: 0.77 mg/dL (ref 0.44–1.00)
GFR calc Af Amer: >60 mL/min (ref >60)
GFR calc non Af Amer: >60 mL/min (ref >60)
Glucose, Bld: 95 mg/dL (ref 70–99)
Potassium: 3.7 mmol/L (ref 3.5–5.1)
Sodium: 138 mmol/L (ref 135–145)

## 2020-05-09 LAB — SARS CORONAVIRUS 2 (TAT 6-24 HRS): SARS Coronavirus 2: NEGATIVE

## 2020-05-09 NOTE — H&P (Signed)
Chief Complaint  Patient presents with  . Post Operative Visit  Right TKA 01/26/20   History of the Present Illness: Amanda Price is a 54 y.o. female here for follow-up evaluation status post right total knee arthroplasty preformed on 01/26/2020. She underwent right knee manipulation on 03/01/2020. She comes back today for recheck to see if she needs an open arthrotomy to regain motion. She saw Brad in therapy last week. She is still quite stiff, but is feeling like she is getting stronger.  The patient states she is doing better as far as her right leg muscles and lifting her right knee. She states she has been trying to stretch out her right knee, but it has not helped.  The patient states she had her teeth removed.  I have reviewed past medical, surgical, social and family history, and allergies as documented in the EMR.  Past Medical History: Past Medical History:  Diagnosis Date  . Hypertension   Past Surgical History: Past Surgical History:  Procedure Laterality Date  . BREAST EXCISIONAL BIOPSY  . COLONOSCOPY 09/26/2015  Entire examined colon is normal/Repeat 57yrs/MUS  . JOINT REPLACEMENT  . knee surgery Right  . REPLACEMENT TOTAL KNEE Right   Past Family History: No family history on file.  Medications: Current Outpatient Medications Ordered in Epic  Medication Sig Dispense Refill  . acetaminophen (TYLENOL) 500 MG tablet Take by mouth  . losartan-hydrochlorothiazide (HYZAAR) 100-25 mg tablet Take 0.5 tablets by mouth once daily  . metoprolol succinate (TOPROL-XL) 50 MG XL tablet Take 1 tablet (50 mg total) by mouth once daily 90 tablet 3  . oxyCODONE (ROXICODONE) 5 MG immediate release tablet Take 1 tablet (5 mg total) by mouth every 6 (six) hours as needed for Pain 20 tablet 0  . XOLEGEL 2 % Gel   No current Epic-ordered facility-administered medications on file.   Allergies: No Known Allergies   Body mass index is 32.59 kg/m.  Review of Systems: A  comprehensive 14 point ROS was performed, reviewed, and the pertinent orthopaedic findings are documented in the HPI.  Vitals:  05/02/20 0922  BP: 140/82   General Physical Examination:  General/Constitutional: No apparent distress: well-nourished and well developed. Eyes: Pupils equal, round with synchronous movement. Lungs: Clear to auscultation HEENT: Normal Vascular: No edema, swelling or tenderness, except as noted in detailed exam. Cardiac: Heart rate and rhythm is regular. Integumentary: No impressive skin lesions present, except as noted in detailed exam. Neuro/Psych: Normal mood and affect, oriented to person, place and time.  Musculoskeletal Examination: On exam, right knee flexion to 15 to 20 degrees.   Lungs are clear. Heart rate and rhythm is normal. HEENT is normal.  Radiographs: No new imaging studies were obtained or reviewed today.  Assessment: ICD-10-CM  1. 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 Primary osteoarthritis of right knee M17.11   Plan: The patient has clinical findings of right knee arthrofibrosis.  We discussed the patient 's treatment options. I recommend an open arthrotomy to regain motion. I explained the procedure in detail. We will see if we can get the patient a CPM machine to use at home.  She will be scheduled for surgery in the near future.   Surgical Risks:  The nature of the condition and the proposed procedure has been reviewed in detail with the patient. Surgical versus non-surgical options and prognosis for recovery have been reviewed and the inherent risks and benefits of each  have been discussed including the risks of infection, bleeding, injury to nerves/blood vessels/tendons, incomplete relief of symptoms, persisting pain and/or stiffness, loss of function, complex regional pain syndrome, failure of the procedure, as appropriate.  Teeth: Edentulous  Scribe  Attestation: I, Dawn Royse, am acting as scribe for [employer], MD    Electronically signed by Marlena Clipper, MD at 05/03/2020 7:27 PM EDT  Reviewed paper H+P, will be scanned into chart. No changes noted.

## 2020-05-10 ENCOUNTER — Ambulatory Visit
Admission: RE | Admit: 2020-05-10 | Discharge: 2020-05-10 | Disposition: A | Discharge status: Home / Self Care | Payer: 59 | Attending: Orthopedic Surgery | Admitting: Orthopedic Surgery

## 2020-05-10 ENCOUNTER — Other Ambulatory Visit: Payer: Self-pay

## 2020-05-10 ENCOUNTER — Ambulatory Visit: Payer: 59 | Admitting: Anesthesiology

## 2020-05-10 ENCOUNTER — Encounter: Payer: Self-pay | Admitting: Orthopedic Surgery

## 2020-05-10 ENCOUNTER — Encounter
Admission: RE | Disposition: A | Discharge status: Home / Self Care | Payer: Self-pay | Source: Home / Self Care | Attending: Orthopedic Surgery

## 2020-05-10 DIAGNOSIS — M24661 Ankylosis, right knee: Secondary | ICD-10-CM | POA: Insufficient documentation

## 2020-05-10 DIAGNOSIS — M1711 Unilateral primary osteoarthritis, right knee: Secondary | ICD-10-CM | POA: Diagnosis not present

## 2020-05-10 DIAGNOSIS — Z96651 Presence of right artificial knee joint: Secondary | ICD-10-CM | POA: Diagnosis not present

## 2020-05-10 DIAGNOSIS — I10 Essential (primary) hypertension: Secondary | ICD-10-CM | POA: Insufficient documentation

## 2020-05-10 DIAGNOSIS — Z79899 Other long term (current) drug therapy: Secondary | ICD-10-CM | POA: Diagnosis not present

## 2020-05-10 HISTORY — PX: KNEE ARTHROTOMY: SHX5881

## 2020-05-10 SURGERY — ARTHROTOMY, KNEE
Anesthesia: General | Site: Knee | Laterality: Right

## 2020-05-10 MED ORDER — DEXMEDETOMIDINE HCL 200 MCG/2ML IV SOLN
INTRAVENOUS | Status: DC | PRN
  Administered 2020-05-10: 16 ug via INTRAVENOUS

## 2020-05-10 MED ORDER — DEXMEDETOMIDINE HCL IN NACL 80 MCG/20ML IV SOLN
INTRAVENOUS | Status: AC
  Filled 2020-05-10: qty 20

## 2020-05-10 MED ORDER — METOCLOPRAMIDE HCL 10 MG PO TABS: 5.0000 mg | ORAL_TABLET | Freq: Three times a day (TID) | ORAL | Status: DC | PRN

## 2020-05-10 MED ORDER — ONDANSETRON HCL 4 MG PO TABS: 4.0000 mg | ORAL_TABLET | Freq: Four times a day (QID) | ORAL | Status: DC | PRN

## 2020-05-10 MED ORDER — NEOMYCIN-POLYMYXIN B GU 40-200000 IR SOLN
Status: AC
  Filled 2020-05-10: qty 20

## 2020-05-10 MED ORDER — FENTANYL CITRATE (PF) 100 MCG/2ML IJ SOLN
25.0000 ug | INTRAMUSCULAR | Status: DC | PRN
  Administered 2020-05-10: 50 ug via INTRAVENOUS

## 2020-05-10 MED ORDER — DEXAMETHASONE SODIUM PHOSPHATE 10 MG/ML IJ SOLN
INTRAMUSCULAR | Status: DC | PRN
  Administered 2020-05-10: 10 mg via INTRAVENOUS

## 2020-05-10 MED ORDER — BUPIVACAINE LIPOSOME 1.3 % IJ SUSP
INTRAMUSCULAR | Status: AC
  Filled 2020-05-10: qty 20

## 2020-05-10 MED ORDER — HYDROMORPHONE HCL 1 MG/ML IJ SOLN: 0.5000 mg | INTRAMUSCULAR | Status: DC | PRN

## 2020-05-10 MED ORDER — SODIUM CHLORIDE 0.9 % IR SOLN
Status: DC | PRN
  Administered 2020-05-10: 1000 mL

## 2020-05-10 MED ORDER — SODIUM CHLORIDE FLUSH 0.9 % IV SOLN
INTRAVENOUS | Status: AC
  Filled 2020-05-10: qty 40

## 2020-05-10 MED ORDER — SUCCINYLCHOLINE CHLORIDE 200 MG/10ML IV SOSY
PREFILLED_SYRINGE | INTRAVENOUS | Status: AC
  Filled 2020-05-10: qty 10

## 2020-05-10 MED ORDER — OXYCODONE HCL 5 MG PO TABS: 10.0000 mg | ORAL_TABLET | ORAL | Status: DC | PRN

## 2020-05-10 MED ORDER — SEVOFLURANE IN SOLN
RESPIRATORY_TRACT | Status: AC
  Filled 2020-05-10: qty 250

## 2020-05-10 MED ORDER — FENTANYL CITRATE (PF) 100 MCG/2ML IJ SOLN
INTRAMUSCULAR | Status: AC
  Administered 2020-05-10: 50 ug via INTRAVENOUS
  Filled 2020-05-10: qty 2

## 2020-05-10 MED ORDER — FENTANYL CITRATE (PF) 100 MCG/2ML IJ SOLN
INTRAMUSCULAR | Status: AC
  Filled 2020-05-10: qty 2

## 2020-05-10 MED ORDER — FAMOTIDINE 20 MG PO TABS
20.0000 mg | ORAL_TABLET | Freq: Once | ORAL | Status: AC
  Administered 2020-05-10: 20 mg via ORAL

## 2020-05-10 MED ORDER — BUPIVACAINE-EPINEPHRINE (PF) 0.25% -1:200000 IJ SOLN
INTRAMUSCULAR | Status: AC
  Filled 2020-05-10: qty 30

## 2020-05-10 MED ORDER — BUPIVACAINE-EPINEPHRINE (PF) 0.25% -1:200000 IJ SOLN
INTRAMUSCULAR | Status: DC | PRN
  Administered 2020-05-10: 30 mL

## 2020-05-10 MED ORDER — LACTATED RINGERS IV SOLN: INTRAVENOUS | Status: DC

## 2020-05-10 MED ORDER — ACETAMINOPHEN 325 MG PO TABS: 325.0000 mg | ORAL_TABLET | Freq: Four times a day (QID) | ORAL | Status: DC | PRN

## 2020-05-10 MED ORDER — PHENYLEPHRINE HCL (PRESSORS) 10 MG/ML IV SOLN
INTRAVENOUS | Status: DC | PRN
  Administered 2020-05-10 (×3): 100 ug via INTRAVENOUS

## 2020-05-10 MED ORDER — LACTATED RINGERS IV SOLN: INTRAVENOUS | Status: DC | PRN

## 2020-05-10 MED ORDER — MIDAZOLAM HCL 2 MG/2ML IJ SOLN
INTRAMUSCULAR | Status: AC
  Filled 2020-05-10: qty 2

## 2020-05-10 MED ORDER — OXYCODONE HCL 5 MG PO TABS
ORAL_TABLET | ORAL | Status: AC
  Filled 2020-05-10: qty 1

## 2020-05-10 MED ORDER — METOCLOPRAMIDE HCL 5 MG/ML IJ SOLN: 5.0000 mg | Freq: Three times a day (TID) | INTRAMUSCULAR | Status: DC | PRN

## 2020-05-10 MED ORDER — EPHEDRINE 5 MG/ML INJ
INTRAVENOUS | Status: AC
  Filled 2020-05-10: qty 10

## 2020-05-10 MED ORDER — PHENYLEPHRINE HCL (PRESSORS) 10 MG/ML IV SOLN
INTRAVENOUS | Status: AC
  Filled 2020-05-10: qty 1

## 2020-05-10 MED ORDER — FENTANYL CITRATE (PF) 100 MCG/2ML IJ SOLN
INTRAMUSCULAR | Status: DC | PRN
  Administered 2020-05-10 (×4): 50 ug via INTRAVENOUS

## 2020-05-10 MED ORDER — OXYCODONE HCL 5 MG PO TABS: 5.0000 mg | ORAL_TABLET | Freq: Once | ORAL | Status: DC | PRN

## 2020-05-10 MED ORDER — PROPOFOL 10 MG/ML IV BOLUS
INTRAVENOUS | Status: DC | PRN
  Administered 2020-05-10: 50 mg via INTRAVENOUS
  Administered 2020-05-10: 150 mg via INTRAVENOUS

## 2020-05-10 MED ORDER — SODIUM CHLORIDE 0.9 % IV SOLN: INTRAVENOUS | Status: DC

## 2020-05-10 MED ORDER — VANCOMYCIN HCL 1000 MG IV SOLR
INTRAVENOUS | Status: AC
  Filled 2020-05-10: qty 1000

## 2020-05-10 MED ORDER — CEFAZOLIN SODIUM-DEXTROSE 2-4 GM/100ML-% IV SOLN
2.0000 g | INTRAVENOUS | Status: AC
  Administered 2020-05-10: 2 g via INTRAVENOUS

## 2020-05-10 MED ORDER — ONDANSETRON HCL 4 MG/2ML IJ SOLN: 4.0000 mg | Freq: Four times a day (QID) | INTRAMUSCULAR | Status: DC | PRN

## 2020-05-10 MED ORDER — MIDAZOLAM HCL 2 MG/2ML IJ SOLN
INTRAMUSCULAR | Status: DC | PRN
  Administered 2020-05-10: 2 mg via INTRAVENOUS

## 2020-05-10 MED ORDER — DEXAMETHASONE SODIUM PHOSPHATE 10 MG/ML IJ SOLN
INTRAMUSCULAR | Status: AC
  Filled 2020-05-10: qty 1

## 2020-05-10 MED ORDER — OXYCODONE HCL 5 MG PO TABS
5.0000 mg | ORAL_TABLET | ORAL | Status: DC | PRN
  Administered 2020-05-10: 5 mg via ORAL

## 2020-05-10 MED ORDER — ORAL CARE MOUTH RINSE: 15.0000 mL | Freq: Once | OROMUCOSAL | Status: AC

## 2020-05-10 MED ORDER — PROPOFOL 10 MG/ML IV BOLUS
INTRAVENOUS | Status: AC
  Filled 2020-05-10: qty 20

## 2020-05-10 MED ORDER — ONDANSETRON HCL 4 MG/2ML IJ SOLN
INTRAMUSCULAR | Status: DC | PRN
  Administered 2020-05-10: 4 mg via INTRAVENOUS

## 2020-05-10 MED ORDER — ACETAMINOPHEN 500 MG PO TABS: 1000.0000 mg | ORAL_TABLET | Freq: Four times a day (QID) | ORAL | Status: DC

## 2020-05-10 MED ORDER — LIDOCAINE HCL (CARDIAC) PF 100 MG/5ML IV SOSY
PREFILLED_SYRINGE | INTRAVENOUS | Status: DC | PRN
  Administered 2020-05-10: 100 mg via INTRAVENOUS

## 2020-05-10 MED ORDER — KETOROLAC TROMETHAMINE 30 MG/ML IJ SOLN
INTRAMUSCULAR | Status: AC
  Filled 2020-05-10: qty 1

## 2020-05-10 MED ORDER — OXYCODONE HCL 5 MG PO TABS: 5.0000 mg | ORAL_TABLET | ORAL | 0 refills | Status: DC | PRN

## 2020-05-10 MED ORDER — ONDANSETRON HCL 4 MG/2ML IJ SOLN
INTRAMUSCULAR | Status: AC
  Filled 2020-05-10: qty 2

## 2020-05-10 MED ORDER — CHLORHEXIDINE GLUCONATE 0.12 % MT SOLN
15.0000 mL | Freq: Once | OROMUCOSAL | Status: AC
  Administered 2020-05-10: 15 mL via OROMUCOSAL

## 2020-05-10 MED ORDER — SODIUM CHLORIDE 0.9 % IV SOLN
INTRAVENOUS | Status: DC | PRN
  Administered 2020-05-10: 60 mL

## 2020-05-10 MED ORDER — OXYCODONE HCL 5 MG/5ML PO SOLN: 5.0000 mg | Freq: Once | ORAL | Status: DC | PRN

## 2020-05-10 MED ORDER — ROCURONIUM BROMIDE 10 MG/ML (PF) SYRINGE
PREFILLED_SYRINGE | INTRAVENOUS | Status: AC
  Filled 2020-05-10: qty 10

## 2020-05-10 SURGICAL SUPPLY — 60 items
BNDG ELASTIC 6X5.8 VLCR NS LF (GAUZE/BANDAGES/DRESSINGS) ×2 IMPLANT
BNDG ELASTIC 6X5.8 VLCR STR LF (GAUZE/BANDAGES/DRESSINGS) ×2 IMPLANT
CANISTER SUCT 1200ML W/VALVE (MISCELLANEOUS) ×2 IMPLANT
CANISTER SUCT 3000ML PPV (MISCELLANEOUS) ×4 IMPLANT
CHLORAPREP W/TINT 26 (MISCELLANEOUS) ×2 IMPLANT
COOLER POLAR GLACIER W/PUMP (MISCELLANEOUS) ×2 IMPLANT
COVER WAND RF STERILE (DRAPES) ×2 IMPLANT
CUFF TOURN 24 STER (MISCELLANEOUS) IMPLANT
CUFF TOURN 30 STER DUAL PORT (MISCELLANEOUS) ×1 IMPLANT
ELECT CAUTERY BLADE 6.4 (BLADE) ×2 IMPLANT
ELECT REM PT RETURN 9FT ADLT (ELECTROSURGICAL) ×2
ELECTRODE REM PT RTRN 9FT ADLT (ELECTROSURGICAL) ×1 IMPLANT
GAUZE SPONGE 4X4 12PLY STRL (GAUZE/BANDAGES/DRESSINGS) ×2 IMPLANT
GAUZE XEROFORM 1X8 LF (GAUZE/BANDAGES/DRESSINGS) ×2 IMPLANT
GLOVE BIOGEL PI IND STRL 9 (GLOVE) ×1 IMPLANT
GLOVE BIOGEL PI INDICATOR 9 (GLOVE) ×1
GLOVE INDICATOR 8.0 STRL GRN (GLOVE) ×2 IMPLANT
GLOVE SURG ORTHO 8.0 STRL STRW (GLOVE) ×2 IMPLANT
GLOVE SURG SYN 9.0  PF PI (GLOVE) ×1
GLOVE SURG SYN 9.0 PF PI (GLOVE) ×1 IMPLANT
GOWN SRG 2XL LVL 4 RGLN SLV (GOWNS) ×1 IMPLANT
GOWN STRL NON-REIN 2XL LVL4 (GOWNS) ×1
GOWN STRL REUS W/ TWL LRG LVL3 (GOWN DISPOSABLE) ×1 IMPLANT
GOWN STRL REUS W/TWL LRG LVL3 (GOWN DISPOSABLE) ×1
HOLDER FOLEY CATH W/STRAP (MISCELLANEOUS) ×2 IMPLANT
HOOD PEEL AWAY FLYTE STAYCOOL (MISCELLANEOUS) ×4 IMPLANT
IMMOB KNEE 24 THIGH 24 443303 (SOFTGOODS) ×2 IMPLANT
KIT PREVENA INCISION MGT 13 (CANNISTER) ×1 IMPLANT
KIT STIMULAN RAPID CURE 5CC (Orthopedic Implant) ×1 IMPLANT
KIT TURNOVER KIT A (KITS) ×2 IMPLANT
NDL SAFETY ECLIPSE 18X1.5 (NEEDLE) ×1 IMPLANT
NDL SPNL 20GX3.5 QUINCKE YW (NEEDLE) ×1 IMPLANT
NEEDLE HYPO 18GX1.5 SHARP (NEEDLE) ×1
NEEDLE SPNL 20GX3.5 QUINCKE YW (NEEDLE) ×2 IMPLANT
NS IRRIG 1000ML POUR BTL (IV SOLUTION) ×2 IMPLANT
NS IRRIG 500ML POUR BTL (IV SOLUTION) ×2 IMPLANT
PACK TOTAL KNEE (MISCELLANEOUS) ×2 IMPLANT
PAD ABD DERMACEA PRESS 5X9 (GAUZE/BANDAGES/DRESSINGS) ×2 IMPLANT
PAD WRAPON POLAR KNEE (MISCELLANEOUS) ×1 IMPLANT
PULSAVAC PLUS IRRIG FAN TIP (DISPOSABLE) ×2
SCALPEL PROTECTED #10 DISP (BLADE) ×4 IMPLANT
SOL .9 NS 3000ML IRR  AL (IV SOLUTION) ×1
SOL .9 NS 3000ML IRR UROMATIC (IV SOLUTION) ×1 IMPLANT
STAPLER SKIN PROX 35W (STAPLE) ×2 IMPLANT
SUCTION FRAZIER HANDLE 10FR (MISCELLANEOUS) ×1
SUCTION TUBE FRAZIER 10FR DISP (MISCELLANEOUS) ×1 IMPLANT
SUT DVC 2 QUILL PDO  T11 36X36 (SUTURE) ×1
SUT DVC 2 QUILL PDO T11 36X36 (SUTURE) ×1 IMPLANT
SUT ETHIBOND 2 V 37 (SUTURE) ×2 IMPLANT
SUT V-LOC 90 ABS DVC 3-0 CL (SUTURE) ×2 IMPLANT
SUT VIC AB 0 CT1 27 (SUTURE) ×1
SUT VIC AB 0 CT1 27XCR 8 STRN (SUTURE) ×1 IMPLANT
SUT VIC AB 0 CT1 36 (SUTURE) ×2 IMPLANT
SUT VIC AB 2-0 CT1 27 (SUTURE) ×1
SUT VIC AB 2-0 CT1 TAPERPNT 27 (SUTURE) ×1 IMPLANT
SYR 10ML LL (SYRINGE) ×2 IMPLANT
SYR 20ML LL LF (SYRINGE) ×2 IMPLANT
SYR 50ML LL SCALE MARK (SYRINGE) ×2 IMPLANT
TIP FAN IRRIG PULSAVAC PLUS (DISPOSABLE) ×1 IMPLANT
WRAPON POLAR PAD KNEE (MISCELLANEOUS) ×2

## 2020-05-10 NOTE — Discharge Instructions (Addendum)
Pain medicine as directed to tolerate therapy Work hard on range of motion and hopefully we can have CPM machine at home.  Will want to to use that 6 hours a day at least. Weightbearing as tolerated. If suction device fills you should have a new canister to put in to help with wound healing. Call office if you are having problems.

## 2020-05-10 NOTE — Anesthesia Postprocedure Evaluation (Signed)
Anesthesia Post Note  Patient: Amanda Price  Procedure(s) Performed: KNEE ARTHROTOMY AND DEBRIDEMENT (Right Knee)  Patient location during evaluation: PACU Anesthesia Type: General Level of consciousness: awake and alert Pain management: pain level controlled Vital Signs Assessment: post-procedure vital signs reviewed and stable Respiratory status: spontaneous breathing, nonlabored ventilation, respiratory function stable and patient connected to nasal cannula oxygen Cardiovascular status: blood pressure returned to baseline and stable Postop Assessment: no apparent nausea or vomiting Anesthetic complications: no   No complications documented.   Last Vitals:  Vitals:   05/10/20 1235 05/10/20 1247  BP: 124/81 110/84  Pulse: 79 91  Resp:  16  Temp:  36.6 C  SpO2: 98% 98%    Last Pain:  Vitals:   05/10/20 1247  TempSrc:   PainSc: 4                  [name] [name]

## 2020-05-10 NOTE — Op Note (Signed)
05/10/2020  11:53 AM  PATIENT:  Amanda Price  54 y.o. 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, right Z96.651 Primary osteoarthritis of right knee M17.11  POST-OPERATIVE DIAGNOSIS:  Status post total knee replacement, right Z96.651 Primary osteoarthritis of right knee M17.11  PROCEDURE:  Procedure(s): KNEE ARTHROTOMY AND DEBRIDEMENT (Right)  SURGEON: Leitha Schuller, MD  ASSISTANTS: Cranston Neighbor, PA-C  ANESTHESIA:   general  EBL:  Total I/O In: 600 [I.V.:500; IV Piggyback:100] Out: 200 [Blood:200]  BLOOD ADMINISTERED:none  DRAINS: Incisional wound VAC   LOCAL MEDICATIONS USED:  MARCAINE    and OTHER Exparel  SPECIMEN:  No Specimen  DISPOSITION OF SPECIMEN:  N/A  COUNTS:  YES  TOURNIQUET:   Total Tourniquet Time Documented: Thigh (Right) - 15 minutes Total: Thigh (Right) - 15 minutes   IMPLANTS: Stimulan beads with vancomycin  DICTATION: .Dragon Dictation patient was brought to the operating room and after adequate anesthesia was obtained the right leg was prepped and draped in the usual sterile fashion.  At the start of the case range of motion approximately 0 to 5 degrees.  After appropriate patient identification and timeout procedure were completed the prior midline skin incision was utilized with extension proximally.  Medial parapatellar arthrotomy was performed after elevating skin flaps the skin itself was also quite adherent with no bursa present.  After the arthrotomy an elevator was used to open up the suprapatellar pouch which were scarred down there was essentially no joint space with scar tissue in both gutters suprapatellar space and infrapatellar space.  After exposing the proximally and the arthrotomy was carried out distally and scar tissue excised with an elevator used to raise scar tissue off the gutters and create that space.  This is done medially and laterally superior and distal  and some scar from the retropatellar area was also excised.  With gentle range of motion unremarkable she can be brought up to 90 degrees after this extensive releases of the adherent capsule.  Tourniquet was let down and there was oozing from multiple surfaces and cannot be entirely controlled.  With pulsatile lavage to flush out the joint and infiltration of Marcaine and Exparel 2.8 postop analgesia stimulant beads were placed in the medial lateral gutters to try to prevent postop infection and the arthrotomy was repaired using a heavy Quill followed by Ethibond to try to maintain the integrity of the capsule which was also too tight when placed in flexion.  The skin was closed with 3 OV lock subcutaneously and skin staples followed by incisional wound VAC  PLAN OF CARE: Discharge to home after PACU  PATIENT DISPOSITION:  PACU - hemodynamically stable.

## 2020-05-10 NOTE — Progress Notes (Signed)
This RN spoke and left voicemails with Baxter Hire, PT at [facility] about pt being set up with home health and CPM machine. This RN also spoke with to [place], [place] about pt discharge and PT set up and pt is ready for discharge.

## 2020-05-10 NOTE — Anesthesia Preprocedure Evaluation (Signed)
Anesthesia Evaluation  Patient identified by MRN, date of birth, ID band Patient awake    Reviewed: Allergy & Precautions, H&P , NPO status , Patient's Chart, lab work & pertinent test results, reviewed documented beta blocker date and time   History of Anesthesia Complications Negative for: history of anesthetic complications  Airway Mallampati: II  TM Distance: >3 FB Neck ROM: limited    Dental  (+) Partial Lower, Missing, Dental Advidsory Given, Poor Dentition   Pulmonary neg pulmonary ROS, neg shortness of breath,    Pulmonary exam normal        Cardiovascular Exercise Tolerance: Good hypertension, (-) angina(-) Past MI and (-) Cardiac Stents Normal cardiovascular exam(-) dysrhythmias (-) Valvular Problems/Murmurs     Neuro/Psych  Headaches, negative psych ROS   GI/Hepatic negative GI ROS, Neg liver ROS, neg GERD  ,  Endo/Other  negative endocrine ROS  Renal/GU negative Renal ROS  negative genitourinary   Musculoskeletal  (+) Arthritis ,   Abdominal   Peds  Hematology negative hematology ROS (+)   Anesthesia Other Findings Past Medical History: No date: Anemia No date: Arthritis No date: Frequent headaches No date: H/O breast biopsy     Comment:  Left Breast No date: Hypertension   Reproductive/Obstetrics negative OB ROS                             Anesthesia Physical  Anesthesia Plan  ASA: II  Anesthesia Plan: General and General LMA   Post-op Pain Management:    Induction: Intravenous  PONV Risk Score and Plan: 2 and Dexamethasone, Ondansetron, Midazolam and Treatment may vary due to age or medical condition  Airway Management Planned: LMA  Additional Equipment:   Intra-op Plan:   Post-operative Plan: Extubation in OR  Informed Consent: I have reviewed the patients History and Physical, chart, labs and discussed the procedure including the risks, benefits and  alternatives for the proposed anesthesia with the patient or authorized representative who has indicated his/her understanding and acceptance.     Dental Advisory Given  Plan Discussed with: CRNA  Anesthesia Plan Comments: (Patient consented for risks of anesthesia including but not limited to:  - adverse reactions to medications - damage to eyes, teeth, lips or other oral mucosa - nerve damage due to positioning  - sore throat or hoarseness - Damage to heart, brain, nerves, lungs, other parts of body or loss of life  Patient voiced understanding.)        Anesthesia Quick Evaluation

## 2020-05-10 NOTE — Progress Notes (Signed)
CPM complete at this time per orders to keep on for x2hrs post surgery. PT is comfortable, NAD noted

## 2020-05-10 NOTE — Progress Notes (Signed)
Per Dr. Rosita Kea pt needs to be set up with home health PT for CPM starting tomorrow 6/30.

## 2020-05-10 NOTE — Transfer of Care (Signed)
Immediate Anesthesia Transfer of Care Note  Patient: Amanda Price  Procedure(s) Performed: KNEE ARTHROTOMY AND DEBRIDEMENT (Right Knee)  Patient Location: PACU  Anesthesia Type:General  Level of Consciousness: sedated  Airway & Oxygen Therapy: Patient Spontanous Breathing and Patient connected to face mask oxygen  Post-op Assessment: Report given to RN and Post -op Vital signs reviewed and stable  Post vital signs: Reviewed and stable  Last Vitals:  Vitals Value Taken Time  BP 126/88 05/10/20 1147  Temp    Pulse 101 05/10/20 1149  Resp 22 05/10/20 1149  SpO2 100 % 05/10/20 1149  Vitals shown include unvalidated device data.  Last Pain:  Vitals:   05/10/20 0831  TempSrc: Oral  PainSc: 5          Complications: No complications documented.

## 2020-05-10 NOTE — Anesthesia Procedure Notes (Signed)
Procedure Name: LMA Insertion Date/Time: 05/10/2020 10:28 AM Performed by: Almeta Monas, CRNA Pre-anesthesia Checklist: Patient identified, Patient being monitored, Timeout performed, Emergency Drugs available and Suction available Patient Re-evaluated:Patient Re-evaluated prior to induction Oxygen Delivery Method: Circle system utilized Preoxygenation: Pre-oxygenation with 100% oxygen Induction Type: IV induction Ventilation: Mask ventilation without difficulty LMA: LMA inserted LMA Size: 3.5 Tube type: Oral Number of attempts: 1 Placement Confirmation: positive ETCO2 and breath sounds checked- equal and bilateral Tube secured with: Tape Dental Injury: Teeth and Oropharynx as per pre-operative assessment

## 2020-05-10 NOTE — Progress Notes (Signed)
Dr. Rosita Kea at bedside to change dressing and remove wound vav. Dr. Eather Colas of pt output into vac of aprox since surgery

## 2020-05-11 ENCOUNTER — Encounter: Payer: Self-pay | Admitting: Orthopedic Surgery

## 2020-05-11 NOTE — Progress Notes (Signed)
Patient called the doctor's office concerning her CPM but it has not been set up yet. I called the office to let them know the importance of having the CPM set up today so that the patient can be on it for 6 hours. The office stated they will look into it.

## 2020-05-27 ENCOUNTER — Ambulatory Visit: Payer: 59 | Admitting: Family

## 2020-05-27 ENCOUNTER — Telehealth (INDEPENDENT_AMBULATORY_CARE_PROVIDER_SITE_OTHER): Payer: 59 | Admitting: Family

## 2020-05-27 ENCOUNTER — Encounter: Payer: Self-pay | Admitting: Family

## 2020-05-27 DIAGNOSIS — I10 Essential (primary) hypertension: Secondary | ICD-10-CM

## 2020-05-27 DIAGNOSIS — M25561 Pain in right knee: Secondary | ICD-10-CM | POA: Diagnosis not present

## 2020-05-27 DIAGNOSIS — M25562 Pain in left knee: Secondary | ICD-10-CM | POA: Diagnosis not present

## 2020-05-27 DIAGNOSIS — D649 Anemia, unspecified: Secondary | ICD-10-CM

## 2020-05-27 DIAGNOSIS — G8929 Other chronic pain: Secondary | ICD-10-CM

## 2020-05-27 NOTE — Assessment & Plan Note (Signed)
Status post recent right total knee replacement.  Follow with orthopedics.  Will follow

## 2020-05-27 NOTE — Assessment & Plan Note (Signed)
Stable, controlled. Continue regimen.  

## 2020-05-27 NOTE — Progress Notes (Signed)
Virtual Visit via Video Note  I connected with@  on 05/27/20 at  3:00 PM EDT by a video enabled telemedicine application and verified that I am speaking with the correct person using two identifiers.  Location patient: home Location provider:work or home office Persons participating in the virtual visit: patient, provider  I discussed the limitations of evaluation and management by telemedicine and the availability of in person appointments. The patient expressed understanding and agreed to proceed.   HPI: Doing well today No complaints.   HTN- compliant with medication.   Recovering from right TKR 2 weeks ago with Dr Rosita Kea.  Consult with Dr Sherryll Burger due to numbness in left hand which has 'gone away.' She doesn't plan to follow up with him.   Hemoglobin 12.4 Crt 0.77 Up-to-date mammogram, Pap smear  ROS: See pertinent positives and negatives per HPI.  Past Medical History:  Diagnosis Date  . Anemia   . Arthritis   . Frequent headaches   . H/O breast biopsy    Left Breast  . Hypertension     Past Surgical History:  Procedure Laterality Date  . BREAST BIOPSY Left    needle per pt  . BREAST SURGERY     Breast biopsy  . COLONOSCOPY N/A 09/26/2015   Procedure: COLONOSCOPY;  Surgeon: Christena Deem, MD;  Location: [facility] ENDOSCOPY;  Service: Endoscopy;  Laterality: N/A;  . FINGER SURGERY    . INTRAUTERINE DEVICE (IUD) INSERTION  04/2014  . JOINT REPLACEMENT Right 01/2019   TKR  . KNEE ARTHROTOMY Right 05/10/2020   Procedure: KNEE ARTHROTOMY AND DEBRIDEMENT;  Surgeon: Kennedy Bucker, MD;  Location: [facility] ORS;  Service: Orthopedics;  Laterality: Right;  . KNEE CLOSED REDUCTION Right 03/01/2020   Procedure: KNEE MANIPULATION UNDER ANESTHESIA;  Surgeon: Kennedy Bucker, MD;  Location: [facility] ORS;  Service: Orthopedics;  Laterality: Right;  . KNEE SURGERY Right   . TOTAL KNEE ARTHROPLASTY Right 01/26/2020   Procedure: TOTAL KNEE ARTHROPLASTY;  Surgeon: Kennedy Bucker, MD;  Location: [facility]  ORS;  Service: Orthopedics;  Laterality: Right;    Family History  Problem Relation Age of Onset  . Healthy Mother   . Healthy Father   . Hypertension Sister   . Diabetes Sister   . Hypertension Sister   . Diabetes Sister   . Hypertension Sister   . Diabetes Sister   . Breast cancer Neg Hx        Current Outpatient Medications:  .  acetaminophen (TYLENOL) 650 MG CR tablet, Take 650 mg by mouth every 8 (eight) hours as needed for pain. , Disp: , Rfl:  .  losartan-hydrochlorothiazide (HYZAAR) 100-12.5 MG tablet, Take 0.5 tablets by mouth daily., Disp: , Rfl:  .  metoprolol succinate (TOPROL-XL) 50 MG 24 hr tablet, Take 50 mg by mouth daily. , Disp: , Rfl:  .  oxyCODONE (ROXICODONE) 5 MG immediate release tablet, Take 1-2 tablets (5-10 mg total) by mouth every 4 (four) hours as needed for moderate pain or severe pain., Disp: 40 tablet, Rfl: 0 .  XOLEGEL 2 % GEL, APPLY EXTERNALLY TO THE AFFECTED AREA TWICE DAILY (Patient taking differently: Apply 1 application topically 2 (two) times daily. ), Disp: 45 g, Rfl: 0  EXAM:  VITALS per patient if applicable: BP Readings from Last 3 Encounters:  05/10/20 123/84  03/01/20 (!) 133/98  01/30/20 117/77     GENERAL: alert, oriented, appears well and in no acute distress  HEENT: atraumatic, conjunttiva clear, no obvious abnormalities on inspection of external nose  and ears  NECK: normal movements of the head and neck  LUNGS: on inspection no signs of respiratory distress, breathing rate appears normal, no obvious gross SOB, gasping or wheezing  CV: no obvious cyanosis  MS: moves all visible extremities without noticeable abnormality  PSYCH/NEURO: pleasant and cooperative, no obvious depression or anxiety, speech and thought processing grossly intact  ASSESSMENT AND PLAN:  Discussed the following assessment and plan:  Essential hypertension  Anemia, unspecified type  Bilateral chronic knee pain Problem List Items Addressed  This Visit      Cardiovascular and Mediastinum   Hypertension    Stable, controlled. Continue regimen.         Other   Anemia    Resolved.      Bilateral chronic knee pain    Status post recent right total knee replacement.  Follow with orthopedics.  Will follow         -we discussed possible serious and likely etiologies, options for evaluation and workup, limitations of telemedicine visit vs in person visit, treatment, treatment risks and precautions. Pt prefers to treat via telemedicine empirically rather then risking or undertaking an in person visit at this moment. Patient agrees to seek prompt in person care if worsening, new symptoms arise, or if is not improving with treatment.   I discussed the assessment and treatment plan with the patient. The patient was provided an opportunity to ask questions and all were answered. The patient agreed with the plan and demonstrated an understanding of the instructions.   The patient was advised to call back or seek an in-person evaluation if the symptoms worsen or if the condition fails to improve as anticipated.   Rennie Plowman, FNP

## 2020-05-27 NOTE — Assessment & Plan Note (Signed)
Resolved

## 2020-06-29 ENCOUNTER — Ambulatory Visit: Payer: 59 | Admitting: Dermatology

## 2020-07-12 ENCOUNTER — Other Ambulatory Visit: Payer: Self-pay

## 2020-07-12 ENCOUNTER — Ambulatory Visit: Payer: 59 | Admitting: Dermatology

## 2020-07-12 DIAGNOSIS — L219 Seborrheic dermatitis, unspecified: Secondary | ICD-10-CM

## 2020-07-12 MED ORDER — KETOCONAZOLE 2 % EX CREA: TOPICAL_CREAM | CUTANEOUS | 5 refills | Status: DC

## 2020-07-12 MED ORDER — HYDROCORTISONE 2.5 % EX CREA: TOPICAL_CREAM | CUTANEOUS | 5 refills | Status: DC

## 2020-07-12 NOTE — Patient Instructions (Addendum)
Recommend daily broad spectrum sunscreen SPF 30+ to sun-exposed areas, reapply every 2 hours as needed. Call for new or changing lesions.  Seborrheic Dermatitis  Mix hydrocortisone with ketaconazole 2% twice a day. If improved, decrease to hydrocortisone and ketaconazole mixed once a day. If still clear, decrease to ketaconazole only.

## 2020-07-12 NOTE — Progress Notes (Deleted)
   Follow-Up Visit   Subjective  Amanda Price is a 54 y.o. female who presents for the following: Medication Refill.  Patient presents today for medication refills for Seb Derm, currently using Ketoconazole 2% cream.   The following portions of the chart were reviewed this encounter and updated as appropriate:      Review of Systems:  No other skin or systemic complaints except as noted in HPI or Assessment and Plan.  Objective  Well appearing patient in no apparent distress; mood and affect are within normal limits.  A focused examination was performed including Face. Relevant physical exam findings are noted in the Assessment and Plan.  Objective  Glabella, Left Ala Nasi, Mid Upper [place] Lip, Right Ala Nasi: Pink patches with greasy scale.    Assessment & Plan  Seborrheic dermatitis (4) Glabella; Left Ala Nasi; Right Ala Nasi; Mid Upper [place] Lip  Continue Ketoconazole cream 2% to affected areas on face mixed together with Hydrocortisone 2.5% cream Start hydrocortisone 2.5% cream  to affected areas on face mixed with Ketoconazole  Mix hydrocortisone with ketaconazole 2% twice a day. If improved, decrease to hydrocortisone and ketaconazole mixed once a day. If still clear, decrease to ketaconazole only.  Ordered Medications: ketoconazole (NIZORAL) 2 % cream hydrocortisone 2.5 % cream  Return in about 1 year (around 07/12/2021) for Seb Derm.  Allen Norris, CMA, am acting as scribe for Willeen Niece, MD .

## 2020-07-12 NOTE — Progress Notes (Signed)
   Follow-Up Visit   Subjective  Amanda Price is a 54 y.o. female who presents for the following: Medication Refill.  Patient presents today for a medication refill for Seb Derm, currently using Ketoconazole cream 2%. Has been out of meds for a while and rash has flared.  No problems in scalp.  The following portions of the chart were reviewed this encounter and updated as appropriate:      Review of Systems:  No other skin or systemic complaints except as noted in HPI or Assessment and Plan.  Objective  Well appearing patient in no apparent distress; mood and affect are within normal limits.  A focused examination was performed including Face. Relevant physical exam findings are noted in the Assessment and Plan.  Objective  Glabella, Left Ala Nasi, Mid Upper [place] Lip, Right Ala Nasi: Pink patches with greasy scale.    Assessment & Plan  Seborrheic dermatitis (4) Glabella; Left Ala Nasi; Right Ala Nasi; Mid Upper [place]ilion Lip  With flare Continue Ketoconazole cream 2% to affected areas on face but mixed together with Hydrocortisone 2.5% cream as below  Mix hydrocortisone with ketaconazole 2% twice a day. If improved, decrease to hydrocortisone and ketaconazole mixed once a day. If still clear, decrease to ketaconazole only for maintenance  Ordered Medications: ketoconazole (NIZORAL) 2 % cream hydrocortisone 2.5 % cream  Return in about 1 year (around 07/12/2021) for Seb Derm.  Allen Norris, CMA, am acting as scribe for Willeen Niece, MD . Documentation: I have reviewed the above documentation for accuracy and completeness, and I agree with the above.  Willeen Niece MD

## 2020-10-27 ENCOUNTER — Other Ambulatory Visit: Payer: Self-pay | Admitting: Advanced Practice Midwife

## 2020-10-27 DIAGNOSIS — B009 Herpesviral infection, unspecified: Secondary | ICD-10-CM

## 2020-12-14 ENCOUNTER — Ambulatory Visit: Payer: 59 | Admitting: Advanced Practice Midwife

## 2020-12-14 ENCOUNTER — Other Ambulatory Visit: Payer: Self-pay

## 2020-12-14 ENCOUNTER — Encounter: Payer: Self-pay | Admitting: Obstetrics and Gynecology

## 2020-12-14 ENCOUNTER — Encounter (INDEPENDENT_AMBULATORY_CARE_PROVIDER_SITE_OTHER): Payer: 59 | Admitting: Obstetrics and Gynecology

## 2020-12-15 ENCOUNTER — Encounter: Payer: Self-pay | Admitting: Advanced Practice Midwife

## 2020-12-15 ENCOUNTER — Ambulatory Visit (INDEPENDENT_AMBULATORY_CARE_PROVIDER_SITE_OTHER): Payer: 59 | Admitting: Advanced Practice Midwife

## 2020-12-15 VITALS — BP 162/102 | HR 94 | Ht 63.0 in | Wt 190.4 lb

## 2020-12-15 DIAGNOSIS — Z0000 Encounter for general adult medical examination without abnormal findings: Secondary | ICD-10-CM

## 2020-12-15 DIAGNOSIS — B009 Herpesviral infection, unspecified: Secondary | ICD-10-CM | POA: Diagnosis not present

## 2020-12-15 DIAGNOSIS — Z1239 Encounter for other screening for malignant neoplasm of breast: Secondary | ICD-10-CM

## 2020-12-15 MED ORDER — VALACYCLOVIR HCL 500 MG PO TABS: 500.0000 mg | ORAL_TABLET | Freq: Every day | ORAL | 3 refills | Status: DC

## 2020-12-15 NOTE — Progress Notes (Signed)
Gynecology Annual Exam  PCP: Burnard Hawthorne, FNP  Chief Complaint:  Chief Complaint  Amanda Price  . Gynecologic Exam    Annual - no concerns. RM 3    History of Present Illness:Amanda is a 55 y.o. [id number] presents for annual exam. The Amanda has no gyn complaints today. She had knee replacement since her last annual. She is still having some pain and she is working on mobility- regularly exercising. Her blood pressure is elevated today and she thinks it could be related to working late yesterday/not enough sleep. She is taking BP medication per PCP and reports usually it is normal range. 2 months ago at her [facility] cardiology visit BP was 128/82.  She requests Rx of valtrex for daily dosing. She denies any HSV outbreaks in the past year.  LMP: No LMP recorded. Amanda is postmenopausal.  Postcoital Bleeding: no Dysmenorrhea: not applicable  The Amanda is occasionally sexually active. She denies dyspareunia.  The Amanda does perform self breast exams.  There is no notable family history of breast or ovarian cancer in her family.  The Amanda wears seatbelts: yes.   The Amanda has regular exercise: she goes to the gym; boxes, rides elyptical and treadmill.  She admits healthy diet and adequate hydration. She admits adequate sleep on the weekends.  The Amanda denies current symptoms of depression.     Review of Systems: Review of Systems  Constitutional: Negative for chills and fever.  HENT: Negative for congestion, ear discharge, ear pain, hearing loss, sinus pain and sore throat.   Eyes: Negative for blurred vision and double vision.  Respiratory: Negative for cough, shortness of breath and wheezing.   Cardiovascular: Negative for chest pain, palpitations and leg swelling.  Gastrointestinal: Negative for abdominal pain, blood in stool, constipation, diarrhea, heartburn, melena, nausea and vomiting.  Genitourinary: Negative for dysuria, flank pain, frequency,  hematuria and urgency.  Musculoskeletal: Positive for joint pain. Negative for back pain and myalgias.  Skin: Negative for itching and rash.  Neurological: Negative for dizziness, tingling, tremors, sensory change, speech change, focal weakness, seizures, loss of consciousness, weakness and headaches.  Endo/Heme/Allergies: Negative for environmental allergies. Does not bruise/bleed easily.  Psychiatric/Behavioral: Negative for depression, hallucinations, memory loss, substance abuse and suicidal ideas. The Amanda is not nervous/anxious and does not have insomnia.     Past Medical History:  Amanda Active Problem List   Diagnosis Date Noted  . Numbness and tingling in left hand 03/30/2020  . Hypertension   . HLD (hyperlipidemia)   . Anemia   . Bilateral chronic knee pain 12/07/2019    Formatting of this note might be different from the original. Last Assessment & Plan:  Advised to add tylenol up to 2000 mg daily in divided doses   . HSV (herpes simplex virus) infection 09/16/2018    Past Surgical History:  Past Surgical History:  Procedure Laterality Date  . BREAST BIOPSY Left    needle per pt  . BREAST SURGERY     Breast biopsy  . COLONOSCOPY N/A 09/26/2015   Procedure: COLONOSCOPY;  Surgeon: Lollie Sails, MD;  Location: [facility] ENDOSCOPY;  Service: Endoscopy;  Laterality: N/A;  . FINGER SURGERY    . INTRAUTERINE DEVICE (IUD) INSERTION  04/2014  . JOINT REPLACEMENT Right 01/2019   TKR  . KNEE ARTHROTOMY Right 05/10/2020   Procedure: KNEE ARTHROTOMY AND DEBRIDEMENT;  Surgeon: Hessie Knows, MD;  Location: [facility] ORS;  Service: Orthopedics;  Laterality: Right;  . KNEE CLOSED  REDUCTION Right 03/01/2020   Procedure: KNEE MANIPULATION UNDER ANESTHESIA;  Surgeon: Hessie Knows, MD;  Location: [facility] ORS;  Service: Orthopedics;  Laterality: Right;  . KNEE SURGERY Right   . TOTAL KNEE ARTHROPLASTY Right 01/26/2020   Procedure: TOTAL KNEE ARTHROPLASTY;  Surgeon: Hessie Knows, MD;   Location: [facility] ORS;  Service: Orthopedics;  Laterality: Right;    Gynecologic History:  No LMP recorded. Amanda is postmenopausal. Last Pap: 1 years ago Results were:  no abnormalities  Last mammogram: 1 year ago Results were: BI-RAD I  Obstetric History: [id number]  Family History:  Family History  Problem Relation Age of Onset  . Healthy Mother   . Healthy Father   . Hypertension Sister   . Diabetes Sister   . Hypertension Sister   . Diabetes Sister   . Hypertension Sister   . Diabetes Sister   . Breast cancer Neg Hx     Social History:  Social History   Socioeconomic History  . Marital status: Single    Spouse name: Not on file  . Number of children: 2  . Years of education: 48  . Highest education level: Not on file  Occupational History  . Occupation: Environmental health practitioner  . Occupation: Freight forwarder  Tobacco Use  . Smoking status: Never Smoker  . Smokeless tobacco: Never Used  Vaping Use  . Vaping Use: Never used  Substance and Sexual Activity  . Alcohol use: Yes    Alcohol/week: 0.0 standard drinks    Comment: Occasionally  . Drug use: No  . Sexual activity: Not Currently    Birth control/protection: Post-menopausal  Other Topics Concern  . Not on file  Social History Narrative   Born in [place].    Living in [place].   Freight forwarder in plant, [place].   Has two sons. One Price lives her.      Social Determinants of Health   Financial Resource Strain: Not on file  Food Insecurity: Not on file  Transportation Needs: Not on file  Physical Activity: Not on file  Stress: Not on file  Social Connections: Not on file  Intimate Partner Violence: Not on file    Allergies:  No Known Allergies  Medications: Prior to Admission medications   Medication Sig Start Date End Date Taking? Authorizing Provider  acetaminophen (TYLENOL) 650 MG CR tablet Take 650 mg by mouth every 8 (eight) hours as needed for pain.    Yes [provider]   hydrocortisone 2.5 % cream Mix hydrocortisone Price ketaconazole 2% twice a day. If improved, decrease to hydrocortisone and ketaconazole mixed once a day. If still clear, decrease to ketaconazole only. 07/12/20  Yes Brendolyn Patty, MD  ketoconazole (NIZORAL) 2 % cream Mix hydrocortisone Price ketaconazole 2% twice a day. If improved, decrease to hydrocortisone and ketaconazole mixed once a day. If still clear, decrease to ketaconazole only. 07/12/20  Yes Brendolyn Patty, MD  losartan-hydrochlorothiazide [facility]) 100-12.5 MG tablet Take 0.5 tablets by mouth daily.   Yes [provider]  metoprolol succinate (TOPROL-XL) 50 MG 24 hr tablet Take 50 mg by mouth daily.  02/11/20 02/10/21 Yes [provider]  valACYclovir (VALTREX) 500 MG tablet Take 1 tablet (500 mg total) by mouth daily. 12/15/20  Yes Rod Can, CNM    Physical Exam Vitals: Blood pressure (!) 162/102, pulse 94, height '5\' 3"'  (1.6 m), weight 190 lb 6 oz (86.4 kg).  General: NAD HEENT: normocephalic, anicteric Thyroid: no enlargement, no palpable nodules Pulmonary: No increased work of breathing,  CTAB Cardiovascular: RRR, distal pulses 2+ Breast: Breast symmetrical, no tenderness, no palpable nodules or masses, no skin or nipple retraction present, no nipple discharge.  No axillary or supraclavicular lymphadenopathy. Abdomen: NABS, soft, non-tender, non-distended.  Umbilicus without lesions.  No hepatomegaly, splenomegaly or masses palpable. No evidence of hernia  Genitourinary: deferred for no concerns/PAP interval Extremities: no edema, erythema, or tenderness Neurologic: Grossly intact Psychiatric: mood appropriate, affect full     Assessment: 55 y.o. [id number] routine annual exam  Plan: Problem List Items Addressed This Visit      Other   HSV (herpes simplex virus) infection   Relevant Medications   valACYclovir (VALTREX) 500 MG tablet    Other Visit Diagnoses    Well woman exam without gynecological exam     -  Primary   Relevant Orders   MM DIGITAL SCREENING BILATERAL   Breast screening       Relevant Orders   MM DIGITAL SCREENING BILATERAL      1) Mammogram - recommend yearly screening mammogram.  Mammogram Was ordered today  2) STI screening  was offered and declined  3) ASCCP guidelines and rationale discussed.  Amanda opts for every 3 years screening interval. Due in early 2024  4) Osteoporosis  - per USPTF routine screening DEXA at age 20  Consider FDA-approved medical therapies in postmenopausal women and men aged 20 years and older, based on the following: a) A hip or vertebral (clinical or morphometric) fracture b) T-score ? -2.5 at the femoral neck or spine after appropriate evaluation to exclude secondary causes C) Low bone mass (T-score between -1.0 and -2.5 at the femoral neck or spine) and a 10-year probability of a hip fracture ? 3% or a 10-year probability of a major osteoporosis-related fracture ? 20% based on the [place]-adapted WHO algorithm   5) Routine healthcare maintenance including cholesterol, diabetes screening discussed managed by PCP  6) Colonoscopy is up to date and due in 2026.  Screening recommended starting at age 28 for average risk individuals, age 27 for individuals deemed at increased risk (including African Americans) and recommended to continue until age 55.  For Amanda age 47-85 individualized approach is recommended.  Gold standard screening is via colonoscopy, Cologuard screening is an acceptable alternative for Amanda unwilling or unable to undergo colonoscopy.  "Colorectal cancer screening for average?risk adults: 2018 guideline update from the American Cancer Society"CA: A Cancer Journal for Clinicians: Apr 10, 2017   7) Return in about 1 year (around 12/15/2021) for annual established gyn.    Christean Leaf, CNM [facility] 12/15/20, 9:23 AM

## 2020-12-20 ENCOUNTER — Other Ambulatory Visit: Payer: Self-pay | Admitting: Advanced Practice Midwife

## 2020-12-20 DIAGNOSIS — Z1231 Encounter for screening mammogram for malignant neoplasm of breast: Secondary | ICD-10-CM

## 2021-01-05 ENCOUNTER — Ambulatory Visit: Payer: 59

## 2021-01-10 ENCOUNTER — Ambulatory Visit
Admission: RE | Admit: 2021-01-10 | Discharge: 2021-01-10 | Disposition: A | Discharge status: Home / Self Care | Payer: 59 | Source: Ambulatory Visit | Attending: Advanced Practice Midwife | Admitting: Advanced Practice Midwife

## 2021-01-10 ENCOUNTER — Other Ambulatory Visit: Payer: Self-pay

## 2021-01-10 DIAGNOSIS — Z1231 Encounter for screening mammogram for malignant neoplasm of breast: Secondary | ICD-10-CM | POA: Diagnosis present

## 2021-02-13 NOTE — Progress Notes (Signed)
Appt canceled!

## 2021-07-15 IMAGING — MG MM DIGITAL SCREENING BILAT W/ TOMO AND CAD
6 of 10 series · 6 of 30 positions shown · non-contrast
Comparison: Previous exam(s).

CLINICAL DATA: Screening.

EXAM:
DIGITAL SCREENING BILATERAL MAMMOGRAM WITH TOMOSYNTHESIS AND CAD
TECHNIQUE: Bilateral screening digital craniocaudal and mediolateral oblique
mammograms were obtained. Bilateral screening digital breast
tomosynthesis was performed. The images were evaluated with
computer-aided detection.

[L CC synth-2D]
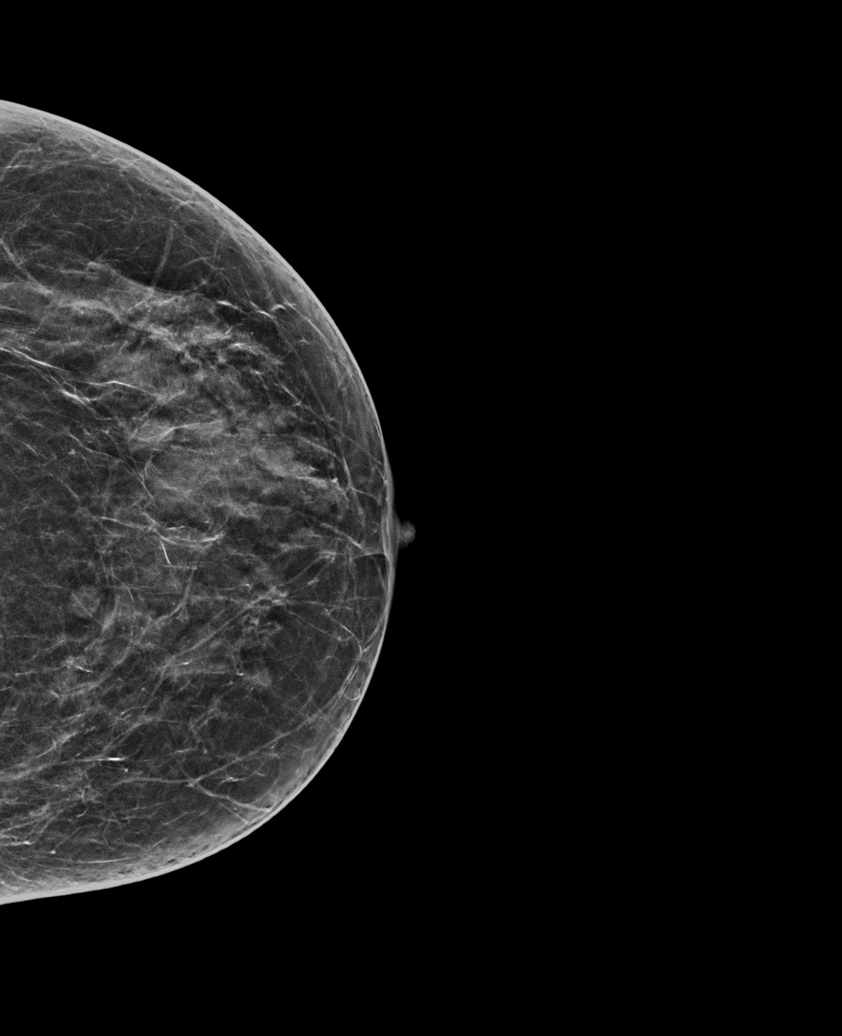

[R MLO synth-2D]
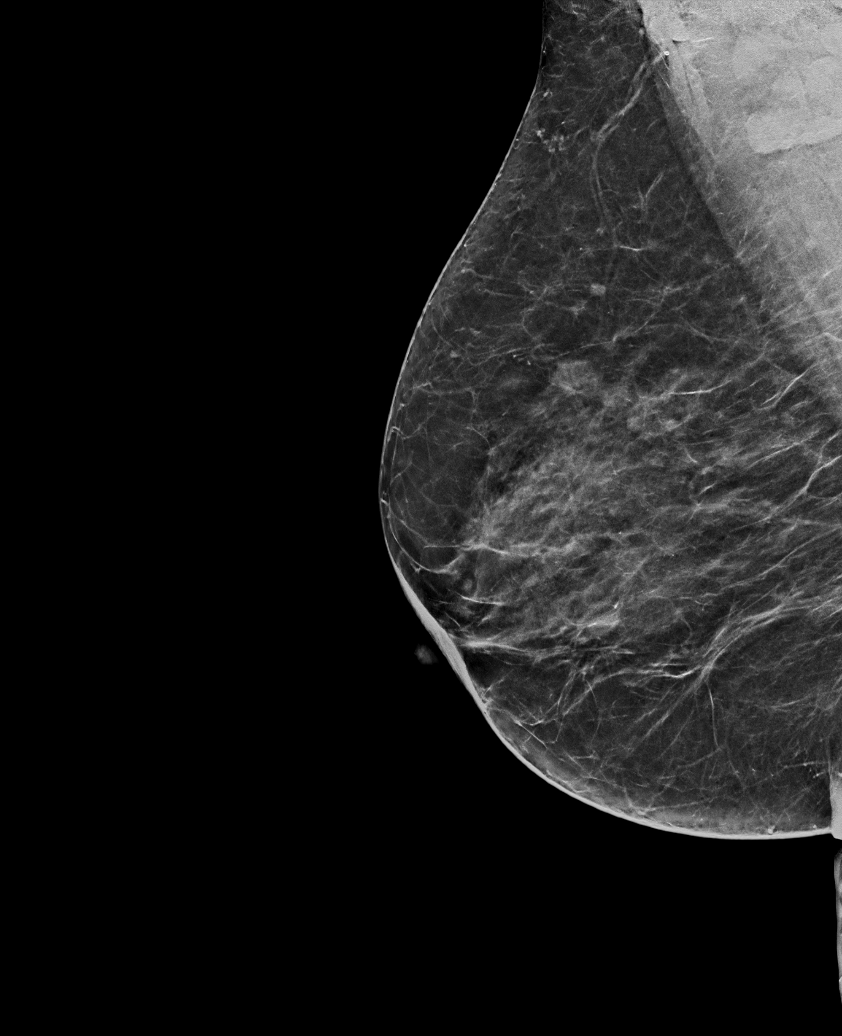

[R CC synth-2D (1 of 2)]
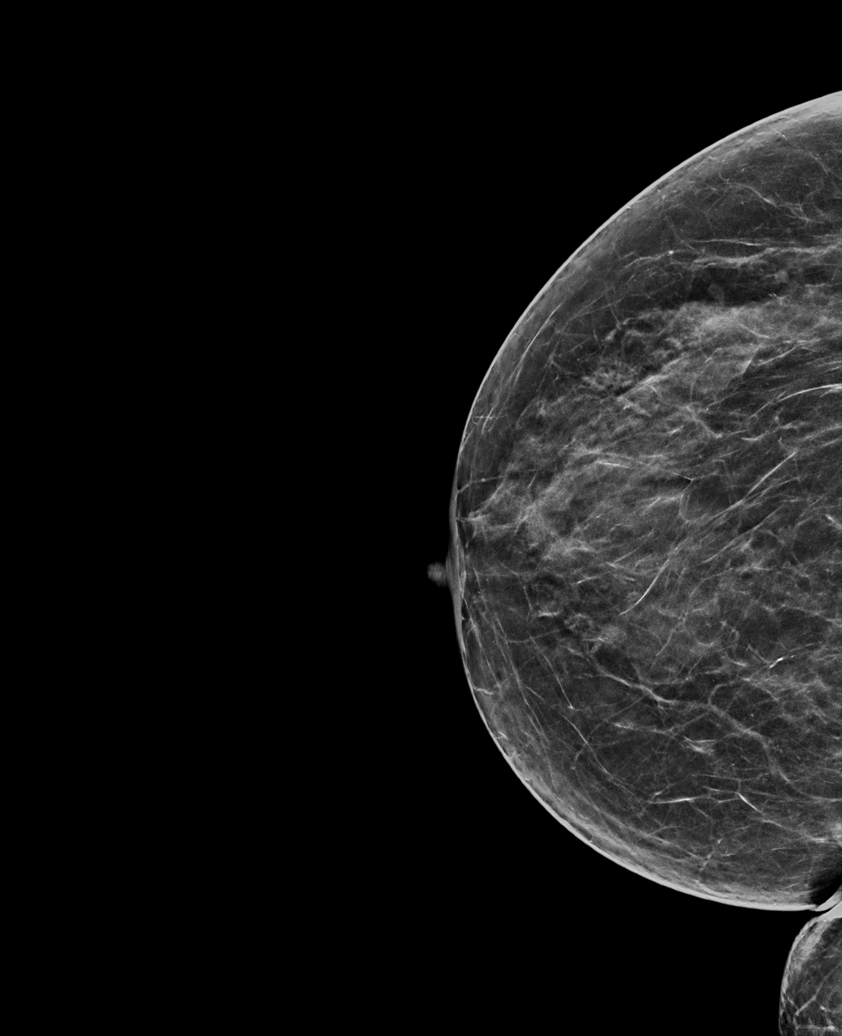

[R CC synth-2D (2 of 2)]
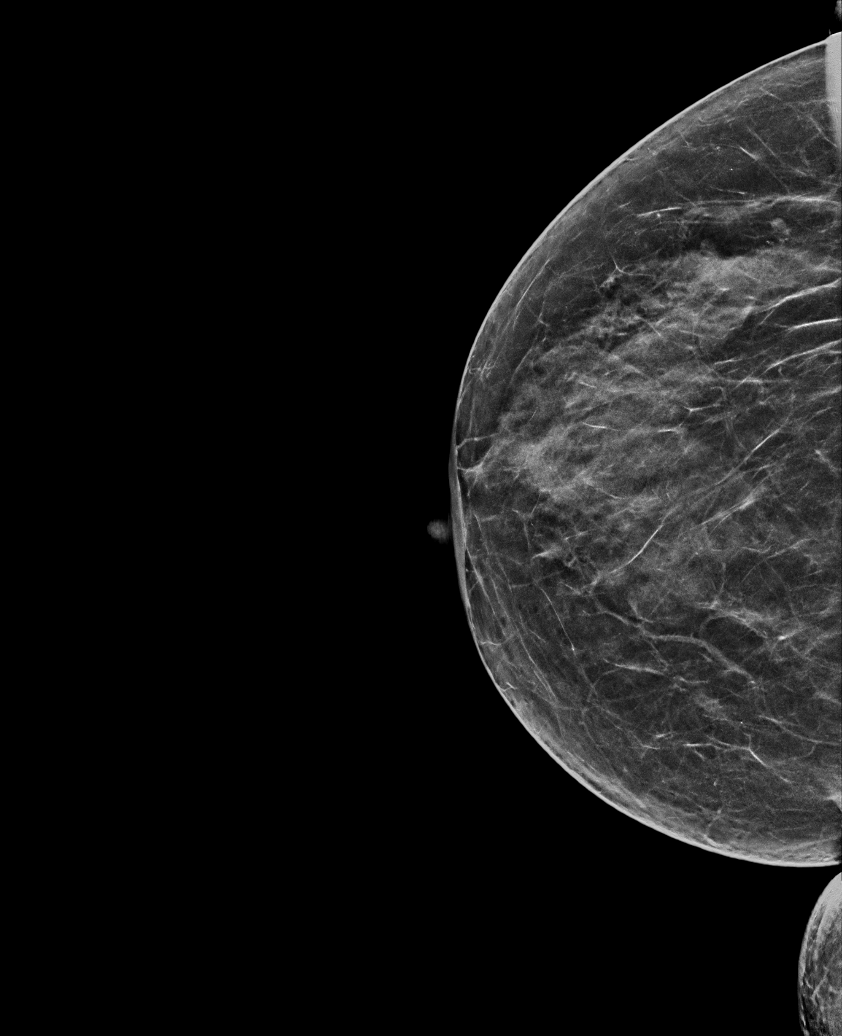

[L MLO synth-2D]
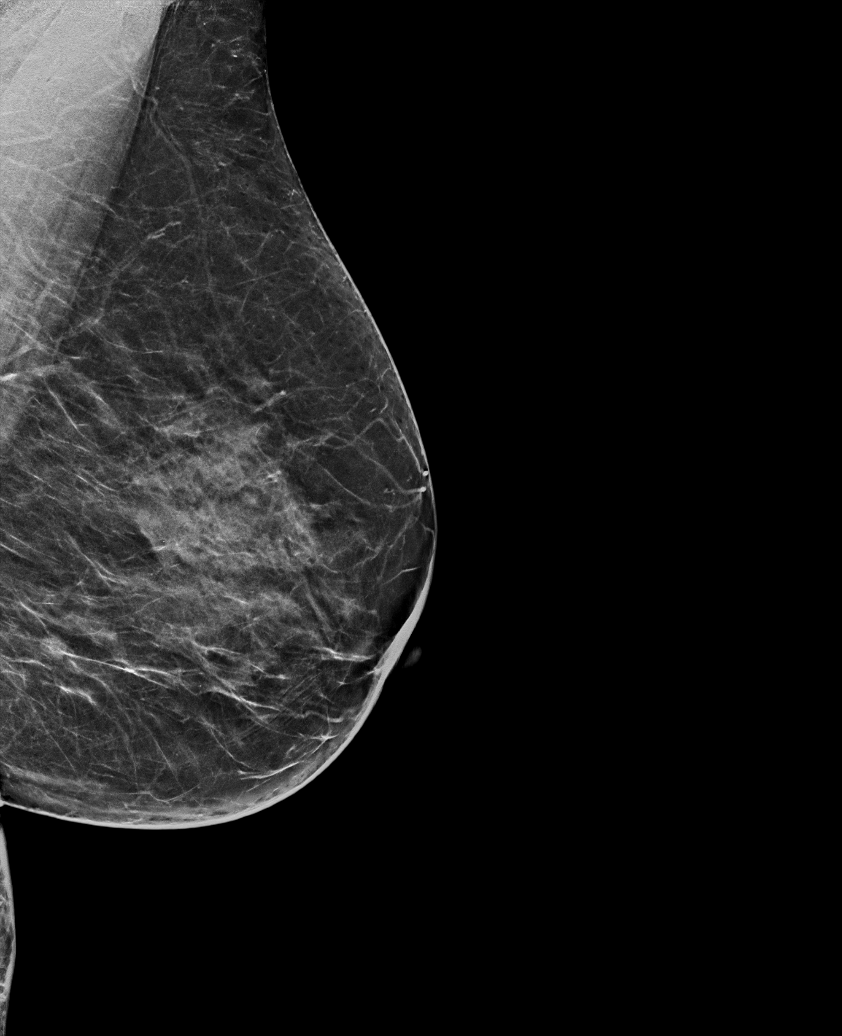

[R MLO tomo · tomo slice 33/65.0]
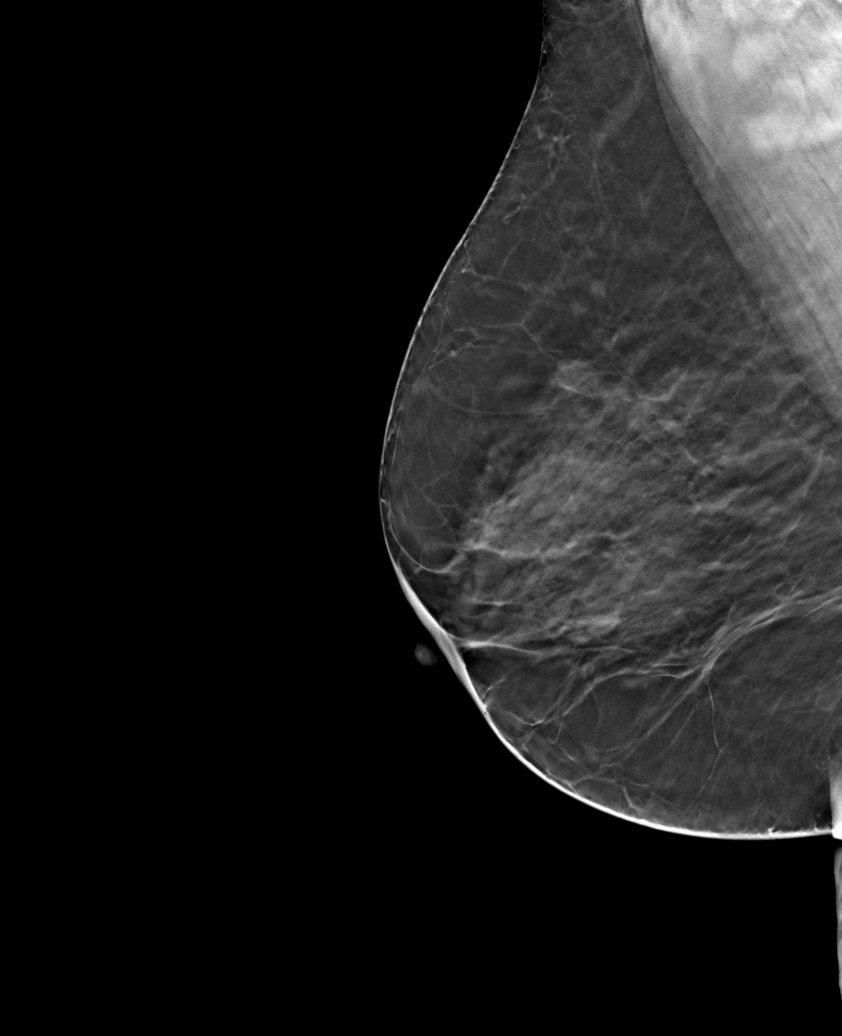

[6 of 30 positions shown; findings below may reference images not displayed]

ACR Breast Density Category c: The breast tissue is heterogeneously
dense, which may obscure small masses.
FINDINGS: There are no findings suspicious for malignancy. The images were
evaluated with computer-aided detection.
IMPRESSION: No mammographic evidence of malignancy. A result letter of this
screening mammogram will be mailed directly to the patient.

RECOMMENDATION:
Screening mammogram in one year. (Code:T4-5-GWO)

BI-RADS CATEGORY  1: Negative.

## 2021-07-18 ENCOUNTER — Other Ambulatory Visit: Payer: Self-pay

## 2021-07-18 ENCOUNTER — Ambulatory Visit: Payer: 59 | Admitting: Dermatology

## 2021-07-18 DIAGNOSIS — L219 Seborrheic dermatitis, unspecified: Secondary | ICD-10-CM | POA: Diagnosis not present

## 2021-07-18 MED ORDER — PIMECROLIMUS 1 % EX CREA: TOPICAL_CREAM | CUTANEOUS | 6 refills | Status: DC

## 2021-07-18 MED ORDER — XOLEGEL 2 % EX GEL
1.0000 | Freq: Every day | CUTANEOUS | 11 refills | Status: DC
Start: 2021-07-18 — End: 2022-01-17

## 2021-07-18 NOTE — Patient Instructions (Addendum)
If you have any questions or concerns for your doctor, please call our main line at [phone] and press option 4 to reach your doctor's medical assistant. If no one answers, please leave a voicemail as directed and we will return your call as soon as possible. Messages left after 4 pm will be answered the following business day.   You may also send [place] a message via MyChart. We typically respond to MyChart messages within 1-2 business days.  For prescription refills, please ask your pharmacy to contact our office. Our fax number is [phone].  If you have an urgent issue when the clinic is closed that cannot wait until the next business day, you can page your doctor at the number below.    Please note that while we do our best to be available for urgent issues outside of office hours, we are not available 24/7.   If you have an urgent issue and are unable to reach [place], you may choose to seek medical care at your doctor's office, retail clinic, urgent care center, or emergency room.  If you have a medical emergency, please immediately call 911 or go to the emergency department.  Pager Numbers  - Dr. Gwen Pounds: [phone]  - Dr. Neale Burly: [phone]  - Dr. Roseanne Reno: [phone]  In the event of inclement weather, please call our main line at [phone] for an update on the status of any delays or closures.  Dermatology Medication Tips: Please keep the boxes that topical medications come in in order to help keep track of the instructions about where and how to use these. Pharmacies typically print the medication instructions only on the boxes and not directly on the medication tubes.   If your medication is too expensive, please contact our office at [phone] option 4 or send [place] a message through MyChart.   We are unable to tell what your co-pay for medications will be in advance as this is different depending on your insurance coverage. However, we may be able to find a substitute  medication at lower cost or fill out paperwork to get insurance to cover a needed medication.   If a prior authorization is required to get your medication covered by your insurance company, please allow [place] 1-2 business days to complete this process.  Drug prices often vary depending on where the prescription is filled and some pharmacies may offer cheaper prices.  The website www.goodrx.com contains coupons for medications through different pharmacies. The prices here do not account for what the cost may be with help from insurance (it may be cheaper with your insurance), but the website can give you the price if you did not use any insurance.  - You can print the associated coupon and take it with your prescription to the pharmacy.  - You may also stop by our office during regular business hours and pick up a GoodRx coupon card.  - If you need your prescription sent electronically to a different pharmacy, notify our office through [facility] or by phone at [phone] option 4.   Restart Xologel nightly to face Start Elidel one to two times a day to affected area of face when face flares

## 2021-07-18 NOTE — Progress Notes (Signed)
   Follow-Up Visit   Subjective  Amanda Price is a 55 y.o. female who presents for the following: Seborrheic Dermatitis (Face, Ketoconazole 2% and HC 2.5% cr, pt prefers to go back to xologel because it worked better, if covered by [employer]). Facial rash is worse, current Rx topicals don't work.   The following portions of the chart were reviewed this encounter and updated as appropriate:       Review of Systems:  No other skin or systemic complaints except as noted in HPI or Assessment and Plan.  Objective  Well appearing patient in no apparent distress; mood and affect are within normal limits.  A focused examination was performed including face. Relevant physical exam findings are noted in the Assessment and Plan.  face Hypopigmented pink scaly macules and patches glabella, perinasal, perioral face   Assessment & Plan  Seborrheic dermatitis face  Seborrheic Dermatitis- flared on current treatment (keto/HC creams)  -  is a chronic persistent rash characterized by pinkness and scaling most commonly of the mid face but also can occur on the scalp (dandruff), ears; mid chest, mid back and groin.  It tends to be exacerbated by stress and cooler weather.  People who have neurologic disease may experience new onset or exacerbation of existing seborrheic dermatitis.  The condition is not curable but treatable and can be controlled.  Restart Xologel qhs to face (will do PA) Start Elidel qd/bid aa face prn flares  Ketoconazole (XOLEGEL) 2 % GEL - face Apply 1 application topically at bedtime. Qhs to face  pimecrolimus (ELIDEL) 1 % cream - face Apply topically as directed. Qd to bid to aa face  Return in about 1 year (around 07/18/2022) for seb derm.  I, Ardis Rowan, RMA, am acting as scribe for Willeen Niece, MD .  Documentation: I have reviewed the above documentation for accuracy and completeness, and I agree with the above.  Willeen Niece MD

## 2021-09-14 ENCOUNTER — Telehealth: Payer: Self-pay

## 2021-09-14 NOTE — Telephone Encounter (Signed)
[facility] pharmacy called and Xolegel not covered. Switched back to Ketoconazole Cream.

## 2022-01-17 ENCOUNTER — Ambulatory Visit (INDEPENDENT_AMBULATORY_CARE_PROVIDER_SITE_OTHER): Payer: 59 | Admitting: Advanced Practice Midwife

## 2022-01-17 ENCOUNTER — Encounter: Payer: Self-pay | Admitting: Advanced Practice Midwife

## 2022-01-17 ENCOUNTER — Other Ambulatory Visit: Payer: Self-pay

## 2022-01-17 VITALS — BP 156/94 | HR 92 | Ht 63.0 in | Wt 194.0 lb

## 2022-01-17 DIAGNOSIS — Z130 Encounter for screening for diseases of the blood and blood-forming organs and certain disorders involving the immune mechanism: Secondary | ICD-10-CM | POA: Diagnosis not present

## 2022-01-17 DIAGNOSIS — Z131 Encounter for screening for diabetes mellitus: Secondary | ICD-10-CM

## 2022-01-17 DIAGNOSIS — Z13220 Encounter for screening for lipoid disorders: Secondary | ICD-10-CM | POA: Diagnosis not present

## 2022-01-17 DIAGNOSIS — Z01419 Encounter for gynecological examination (general) (routine) without abnormal findings: Secondary | ICD-10-CM

## 2022-01-17 DIAGNOSIS — E785 Hyperlipidemia, unspecified: Secondary | ICD-10-CM

## 2022-01-17 DIAGNOSIS — B009 Herpesviral infection, unspecified: Secondary | ICD-10-CM

## 2022-01-17 DIAGNOSIS — Z1239 Encounter for other screening for malignant neoplasm of breast: Secondary | ICD-10-CM

## 2022-01-17 DIAGNOSIS — I10 Essential (primary) hypertension: Secondary | ICD-10-CM

## 2022-01-17 MED ORDER — VALACYCLOVIR HCL 500 MG PO TABS: 500.0000 mg | ORAL_TABLET | Freq: Every day | ORAL | 3 refills | Status: DC

## 2022-01-17 NOTE — Progress Notes (Signed)
Gynecology Annual Exam  PCP: Allegra Grana, FNP  Chief Complaint:  Chief Complaint  Patient presents with   Annual Exam    History of Present Illness:Patient is a 56 y.o. [phone] presents for annual exam. The patient has no gyn complaints today. She was seen by [facility] about 6 months ago. They recommended annual lipid and other basic labs by PCP. We will do labs today. She has not had any HSV outbreaks and has not been taking valtrex daily.  LMP: No LMP recorded. Patient is postmenopausal. No bleeding.   The patient is not sexually active. She denies dyspareunia.  The patient does perform self breast exams.  There is no notable family history of breast or ovarian cancer in her family.  The patient wears seatbelts: yes.   The patient has regular exercise:  she has limited exercise since her annual last year, she admits generally good diet, she drinks mountain dew daily, she usually has 6-8 hours of sleep .    The patient denies current symptoms of depression.     Review of Systems: Review of Systems  Constitutional:  Negative for chills and fever.  HENT:  Negative for congestion, ear discharge, ear pain, hearing loss, sinus pain and sore throat.   Eyes:  Negative for blurred vision and double vision.  Respiratory:  Negative for cough, shortness of breath and wheezing.   Cardiovascular:  Negative for chest pain, palpitations and leg swelling.  Gastrointestinal:  Negative for abdominal pain, blood in stool, constipation, diarrhea, heartburn, melena, nausea and vomiting.  Genitourinary:  Negative for dysuria, flank pain, frequency, hematuria and urgency.  Musculoskeletal:  Positive for joint pain. Negative for back pain and myalgias.  Skin:  Negative for itching and rash.  Neurological:  Negative for dizziness, tingling, tremors, sensory change, speech change, focal weakness, seizures, loss of consciousness, weakness and headaches.  Endo/Heme/Allergies:   Negative for environmental allergies. Does not bruise/bleed easily.  Psychiatric/Behavioral:  Negative for depression, hallucinations, memory loss, substance abuse and suicidal ideas. The patient is not nervous/anxious and does not have insomnia.    Past Medical History:  Patient Active Problem List   Diagnosis Date Noted   Numbness and tingling in left hand 03/30/2020   Hypertension    HLD (hyperlipidemia)    Anemia    Bilateral chronic knee pain 12/07/2019    Formatting of this note might be different from the original. Last Assessment & Plan:  Advised to add tylenol up to 2000 mg daily in divided doses    HSV (herpes simplex virus) infection 09/16/2018    Past Surgical History:  Past Surgical History:  Procedure Laterality Date   BREAST BIOPSY Left    needle per pt   BREAST SURGERY     Breast biopsy   COLONOSCOPY N/A 09/26/2015   Procedure: COLONOSCOPY;  Surgeon: Christena Deem, MD;  Location: [facility] ENDOSCOPY;  Service: Endoscopy;  Laterality: N/A;   FINGER SURGERY     INTRAUTERINE DEVICE (IUD) INSERTION  04/2014   JOINT REPLACEMENT Right 01/2019   TKR   KNEE ARTHROTOMY Right 05/10/2020   Procedure: KNEE ARTHROTOMY AND DEBRIDEMENT;  Surgeon: Kennedy Bucker, MD;  Location: [facility] ORS;  Service: Orthopedics;  Laterality: Right;   KNEE CLOSED REDUCTION Right 03/01/2020   Procedure: KNEE MANIPULATION UNDER ANESTHESIA;  Surgeon: Kennedy Bucker, MD;  Location: [facility] ORS;  Service: Orthopedics;  Laterality: Right;   KNEE SURGERY Right    TOTAL KNEE ARTHROPLASTY Right 01/26/2020   Procedure:  TOTAL KNEE ARTHROPLASTY;  Surgeon: Kennedy Bucker, MD;  Location: [facility] ORS;  Service: Orthopedics;  Laterality: Right;    Gynecologic History:  No LMP recorded. Patient is postmenopausal. Last Pap: 09/24/19 Results were:  no abnormalities  Last mammogram: 01/10/21 Results were: Elby Showers I  Obstetric History: [id number]  Family History:  Family History  Problem Relation Age of Onset   Healthy Mother     Healthy Father    Hypertension Sister    Diabetes Sister    Hypertension Sister    Diabetes Sister    Hypertension Sister    Diabetes Sister    Breast cancer Neg Hx     Social History:  Social History   Socioeconomic History   Marital status: Single    Spouse name: Not on file   Number of children: 2   Years of education: 14   Highest education level: Not on file  Occupational History   Occupation: [employer] consultant   Occupation: Estate agent  Tobacco Use   Smoking status: Never   Smokeless tobacco: Never  Vaping Use   Vaping Use: Never used  Substance and Sexual Activity   Alcohol use: Yes    Alcohol/week: 0.0 standard drinks    Comment: Occasionally   Drug use: No   Sexual activity: Not Currently    Birth control/protection: Post-menopausal  Other Topics Concern   Not on file  Social History Narrative   Born in [place].    Living in [place].   Estate agent in plant, [place].   Has two sons. One with lives her.      Social Determinants of Health   Financial Resource Strain: Not on file  Food Insecurity: Not on file  Transportation Needs: Not on file  Physical Activity: Not on file  Stress: Not on file  Social Connections: Not on file  Intimate Partner Violence: Not on file    Allergies:  No Known Allergies  Medications: Prior to Admission medications   Medication Sig Start Date End Date Taking? Authorizing Provider  acetaminophen (TYLENOL) 650 MG CR tablet Take 650 mg by mouth every 8 (eight) hours as needed for pain.    Yes [provider]  ketoconazole (NIZORAL) 2 % cream SMARTSIG:1 Topical Daily 12/07/21  Yes [provider]  losartan-hydrochlorothiazide (HYZAAR) 50-12.5 MG tablet Take 1 tablet by mouth daily. 12/20/21  Yes [provider]  metoprolol succinate (TOPROL-XL) 50 MG 24 hr tablet TAKE 1 TABLET(50 MG) BY MOUTH EVERY DAY 07/20/21  Yes [provider]  pimecrolimus (ELIDEL) 1 % cream Apply topically as  directed. Qd to bid to aa face 07/18/21  Yes Willeen Niece, MD  valACYclovir (VALTREX) 500 MG tablet Take 1 tablet (500 mg total) by mouth daily. 01/17/22   Tresea Mall, CNM    Physical Exam Vitals: Blood pressure (!) 156/94, pulse 92, height 5\' 3"  (1.6 m), weight 194 lb (88 kg).  General: NAD HEENT: normocephalic, anicteric Thyroid: no enlargement, no palpable nodules Pulmonary: No increased work of breathing, CTAB Cardiovascular: RRR, distal pulses 2+ Breast: Breast symmetrical, no tenderness, no palpable nodules or masses, no skin or nipple retraction present, no nipple discharge.  No axillary or supraclavicular lymphadenopathy. Abdomen: NABS, soft, non-tender, non-distended.  Umbilicus without lesions.  No hepatomegaly, splenomegaly or masses palpable. No evidence of hernia  Genitourinary: deferred for no concerns/PAP interval Extremities: no edema, erythema, or tenderness Neurologic: Grossly intact Psychiatric: mood appropriate, affect full    Assessment: 56 y.o. [id number] routine annual exam  Plan: Problem List  Items Addressed This Visit       Cardiovascular and Mediastinum   Hypertension   Relevant Medications   metoprolol succinate (TOPROL-XL) 50 MG 24 hr tablet   losartan-hydrochlorothiazide (HYZAAR) 50-12.5 MG tablet   Other Relevant Orders   Comprehensive metabolic panel     Other   HSV (herpes simplex virus) infection   Relevant Medications   ketoconazole (NIZORAL) 2 % cream   valACYclovir (VALTREX) 500 MG tablet   HLD (hyperlipidemia)   Relevant Medications   metoprolol succinate (TOPROL-XL) 50 MG 24 hr tablet   losartan-hydrochlorothiazide (HYZAAR) 50-12.5 MG tablet   Other Relevant Orders   Lipid Panel With LDL/HDL Ratio   Other Visit Diagnoses     Well woman exam with routine gynecological exam    -  Primary   Relevant Orders   CBC with Differential/Platelet   Comprehensive metabolic panel   Hgb A1c w/o eAG   Lipid Panel With LDL/HDL Ratio    Screening for diabetes mellitus       Relevant Orders   Hgb A1c w/o eAG   Screening cholesterol level       Relevant Orders   Lipid Panel With LDL/HDL Ratio   Screening for iron deficiency anemia       Relevant Orders   CBC with Differential/Platelet   Breast screening       Relevant Orders   MM 3D SCREEN BREAST BILATERAL       1) Mammogram - recommend yearly screening mammogram.  Mammogram Was ordered today  2) STI screening  was not offered and therefore not obtained  3) ASCCP guidelines and rationale discussed.  Patient opts for every 3 years screening interval. Due at next annual  4) Osteoporosis  - per USPTF routine screening DEXA at age 63  Consider FDA-approved medical therapies in postmenopausal women and men aged 83 years and older, based on the following: a) A hip or vertebral (clinical or morphometric) fracture b) T-score ? -2.5 at the femoral neck or spine after appropriate evaluation to exclude secondary causes C) Low bone mass (T-score between -1.0 and -2.5 at the femoral neck or spine) and a 10-year probability of a hip fracture ? 3% or a 10-year probability of a major osteoporosis-related fracture ? 20% based on the [place]-adapted WHO algorithm   5) Routine healthcare maintenance including cholesterol, diabetes screening discussed Ordered today  6) Colonoscopy normal at age 24 and due for screening in 3 years.  Screening recommended starting at age 31 for average risk individuals, age 71 for individuals deemed at increased risk (including African Americans) and recommended to continue until age 16.  For patient age 71-85 individualized approach is recommended.  Gold standard screening is via colonoscopy, Cologuard screening is an acceptable alternative for patient unwilling or unable to undergo colonoscopy.  "Colorectal cancer screening for average?risk adults: 2018 guideline update from the American Cancer Society"CA: A Cancer Journal for Clinicians: Apr 10, 2017   7)  Increase healthy lifestyle; hydration/reduce intake of soda, exercise  8) Return in about 1 year (around 01/18/2023) for annual established gyn.   Parke Poisson, CNM [facility] 01/17/22, 2:08 PM

## 2022-01-18 LAB — CBC WITH DIFFERENTIAL/PLATELET
Basophils Absolute: 0.1 10*3/uL (ref 0.0–0.2)
Basos: 1 %
EOS (ABSOLUTE): 0.2 10*3/uL (ref 0.0–0.4)
Eos: 2 %
Hematocrit: 32.7 % — ABNORMAL LOW (ref 34.0–46.6)
Hemoglobin: 10.2 g/dL — ABNORMAL LOW (ref 11.1–15.9)
Immature Grans (Abs): 0 10*3/uL (ref 0.0–0.1)
Immature Granulocytes: 0 %
Lymphocytes Absolute: 2.8 10*3/uL (ref 0.7–3.1)
Lymphs: 34 %
MCH: 24 pg — ABNORMAL LOW (ref 26.6–33.0)
MCHC: 31.2 g/dL — ABNORMAL LOW (ref 31.5–35.7)
MCV: 77 fL — ABNORMAL LOW (ref 79–97)
Monocytes Absolute: 0.8 10*3/uL (ref 0.1–0.9)
Monocytes: 9 %
Neutrophils Absolute: 4.4 10*3/uL (ref 1.4–7.0)
Neutrophils: 54 %
Platelets: 278 10*3/uL (ref 150–450)
RBC: 4.25 x10E6/uL (ref 3.77–5.28)
RDW: 18 % — ABNORMAL HIGH (ref 11.7–15.4)
WBC: 8.2 10*3/uL (ref 3.4–10.8)

## 2022-01-18 LAB — LIPID PANEL WITH LDL/HDL RATIO
Cholesterol, Total: 185 mg/dL (ref 100–199)
HDL: 44 mg/dL (ref >39)
LDL Chol Calc (NIH): 125 mg/dL — ABNORMAL HIGH (ref 0–99)
LDL/HDL Ratio: 2.8 ratio (ref 0.0–3.2)
Triglycerides: 87 mg/dL (ref 0–149)
VLDL Cholesterol Cal: 16 mg/dL (ref 5–40)

## 2022-01-18 LAB — COMPREHENSIVE METABOLIC PANEL
ALT: 11 IU/L (ref 0–32)
AST: 15 IU/L (ref 0–40)
Albumin/Globulin Ratio: 1.6 (ref 1.2–2.2)
Albumin: 4.3 g/dL (ref 3.8–4.9)
Alkaline Phosphatase: 113 IU/L (ref 44–121)
BUN/Creatinine Ratio: 23 (ref 9–23)
BUN: 14 mg/dL (ref 6–24)
Bilirubin Total: <0.2 mg/dL (ref 0.0–1.2)
CO2: 22 mmol/L (ref 20–29)
Calcium: 9.7 mg/dL (ref 8.7–10.2)
Chloride: 103 mmol/L (ref 96–106)
Creatinine, Ser: 0.6 mg/dL (ref 0.57–1.00)
Globulin, Total: 2.7 g/dL (ref 1.5–4.5)
Glucose: 88 mg/dL (ref 70–99)
Potassium: 3.9 mmol/L (ref 3.5–5.2)
Sodium: 140 mmol/L (ref 134–144)
Total Protein: 7 g/dL (ref 6.0–8.5)
eGFR: 105 mL/min/{1.73_m2} (ref >59)

## 2022-01-18 LAB — HGB A1C W/O EAG: Hgb A1c MFr Bld: 5.7 % — ABNORMAL HIGH (ref 4.8–5.6)

## 2022-02-01 ENCOUNTER — Ambulatory Visit
Admission: RE | Admit: 2022-02-01 | Discharge: 2022-02-01 | Disposition: A | Discharge status: Home / Self Care | Payer: 59 | Source: Ambulatory Visit | Attending: Advanced Practice Midwife | Admitting: Advanced Practice Midwife

## 2022-02-01 ENCOUNTER — Other Ambulatory Visit: Payer: Self-pay

## 2022-02-01 DIAGNOSIS — Z1231 Encounter for screening mammogram for malignant neoplasm of breast: Secondary | ICD-10-CM | POA: Diagnosis not present

## 2022-02-01 DIAGNOSIS — Z1239 Encounter for other screening for malignant neoplasm of breast: Secondary | ICD-10-CM

## 2022-07-23 ENCOUNTER — Encounter: Payer: 59 | Admitting: Dermatology

## 2022-09-27 ENCOUNTER — Other Ambulatory Visit: Payer: Self-pay | Admitting: Dermatology

## 2022-09-27 DIAGNOSIS — L219 Seborrheic dermatitis, unspecified: Secondary | ICD-10-CM

## 2022-10-22 ENCOUNTER — Other Ambulatory Visit: Payer: Self-pay

## 2022-10-22 DIAGNOSIS — B009 Herpesviral infection, unspecified: Secondary | ICD-10-CM

## 2022-10-22 MED ORDER — VALACYCLOVIR HCL 500 MG PO TABS: 500.0000 mg | ORAL_TABLET | Freq: Every day | ORAL | 0 refills | Status: DC

## 2022-11-08 ENCOUNTER — Ambulatory Visit (INDEPENDENT_AMBULATORY_CARE_PROVIDER_SITE_OTHER): Payer: 59 | Admitting: Dermatology

## 2022-11-08 DIAGNOSIS — L219 Seborrheic dermatitis, unspecified: Secondary | ICD-10-CM

## 2022-11-08 MED ORDER — PIMECROLIMUS 1 % EX CREA: TOPICAL_CREAM | CUTANEOUS | 6 refills | Status: DC

## 2022-11-08 MED ORDER — KETOCONAZOLE 2 % EX CREA: TOPICAL_CREAM | CUTANEOUS | 6 refills | Status: DC

## 2022-11-08 NOTE — Progress Notes (Signed)
   Follow-Up Visit   Subjective  Amanda Price is a 56 y.o. female who presents for the following: Follow-up (Patient here for refills of Ketoconazole cream and Elidel cream she uses for a rash on her face.).  Topicals help clear up rash after few days of use.  Currently flared because she is out of medication.    The following portions of the chart were reviewed this encounter and updated as appropriate:       Review of Systems:  No other skin or systemic complaints except as noted in HPI or Assessment and Plan.  Objective  Well appearing patient in no apparent distress; mood and affect are within normal limits.  A focused examination was performed including face . Relevant physical exam findings are noted in the Assessment and Plan.  perinasal, eyebrows Hypopigmented scaly macules and patches     Assessment & Plan  Seborrheic dermatitis perinasal, eyebrows  Chronic and persistent condition with duration or expected duration over one year. Condition is bothersome/symptomatic for patient. Currently flared.   Seborrheic Dermatitis  -  is a chronic persistent rash characterized by pinkness and scaling most commonly of the mid face but also can occur on the scalp (dandruff), ears; mid chest, mid back and groin.  It tends to be exacerbated by stress and cooler weather.  People who have neurologic disease may experience new onset or exacerbation of existing seborrheic dermatitis.  The condition is not curable but treatable and can be controlled.   restart Elidel  cream apply to face qd-bid as directed restart Ketoconazole 2% cream apply to face qd-bid as directed  ketoconazole (NIZORAL) 2 % cream - perinasal, eyebrows Apply to face qd-bid  Related Medications pimecrolimus (ELIDEL) 1 % cream Apply topically as directed. Qd to bid to aa face   Return in about 1 year (around 11/09/2023) for seb derm .  I, Angelique Holm, CMA, am acting as scribe for Willeen Niece, MD .    Documentation: I have reviewed the above documentation for accuracy and completeness, and I agree with the above.  Willeen Niece MD

## 2022-11-08 NOTE — Patient Instructions (Signed)
Due to recent changes in healthcare laws, you may see results of your pathology and/or laboratory studies on MyChart before the doctors have had a chance to review them. We understand that in some cases there may be results that are confusing or concerning to you. Please understand that not all results are received at the same time and often the doctors may need to interpret multiple results in order to provide you with the best plan of care or course of treatment. Therefore, we ask that you please give us 2 business days to thoroughly review all your results before contacting the office for clarification. Should we see a critical lab result, you will be contacted sooner.   If You Need Anything After Your Visit  If you have any questions or concerns for your doctor, please call our main line at [phone] and press option 4 to reach your doctor's medical assistant. If no one answers, please leave a voicemail as directed and we will return your call as soon as possible. Messages left after 4 pm will be answered the following business day.   You may also send us a message via MyChart. We typically respond to MyChart messages within 1-2 business days.  For prescription refills, please ask your pharmacy to contact our office. Our fax number is [phone].  If you have an urgent issue when the clinic is closed that cannot wait until the next business day, you can page your doctor at the number below.    Please note that while we do our best to be available for urgent issues outside of office hours, we are not available 24/7.   If you have an urgent issue and are unable to reach us, you may choose to seek medical care at your doctor's office, retail clinic, urgent care center, or emergency room.  If you have a medical emergency, please immediately call 911 or go to the emergency department.  Pager Numbers  - Dr. Kowalski: [phone]  - Dr. Moye: [phone]  - Dr. Stewart:  [phone]  In the event of inclement weather, please call our main line at [phone] for an update on the status of any delays or closures.  Dermatology Medication Tips: Please keep the boxes that topical medications come in in order to help keep track of the instructions about where and how to use these. Pharmacies typically print the medication instructions only on the boxes and not directly on the medication tubes.   If your medication is too expensive, please contact our office at [phone] option 4 or send us a message through MyChart.   We are unable to tell what your co-pay for medications will be in advance as this is different depending on your insurance coverage. However, we may be able to find a substitute medication at lower cost or fill out paperwork to get insurance to cover a needed medication.   If a prior authorization is required to get your medication covered by your insurance company, please allow us 1-2 business days to complete this process.  Drug prices often vary depending on where the prescription is filled and some pharmacies may offer cheaper prices.  The website www.goodrx.com contains coupons for medications through different pharmacies. The prices here do not account for what the cost may be with help from insurance (it may be cheaper with your insurance), but the website can give you the price if you did not use any insurance.  - You can print the associated coupon and take it with   your prescription to the pharmacy.  - You may also stop by our office during regular business hours and pick up a GoodRx coupon card.  - If you need your prescription sent electronically to a different pharmacy, notify our office through [place] MyChart or by phone at [phone] option 4.     Si Usted Necesita Algo Despus de Su Visita  Tambin puede enviarnos un mensaje a travs de MyChart. Por lo general respondemos a los mensajes de MyChart en el transcurso de 1 a 2  das hbiles.  Para renovar recetas, por favor pida a su farmacia que se ponga en contacto con nuestra oficina. Nuestro nmero de fax es el [phone].  Si tiene un asunto urgente cuando la clnica est cerrada y que no puede esperar hasta el siguiente da hbil, puede llamar/localizar a su doctor(a) al nmero que aparece a continuacin.   Por favor, tenga en cuenta que aunque hacemos todo lo posible para estar disponibles para asuntos urgentes fuera del horario de oficina, no estamos disponibles las 24 horas del da, los 7 das de la semana.   Si tiene un problema urgente y no puede comunicarse con nosotros, puede optar por buscar atencin mdica  en el consultorio de su doctor(a), en una clnica privada, en un centro de atencin urgente o en una sala de emergencias.  Si tiene una emergencia mdica, por favor llame inmediatamente al 911 o vaya a la sala de emergencias.  Nmeros de bper  - Dr. Kowalski: [phone]  - Dra. Moye: [phone]  - Dra. Stewart: [phone]  En caso de inclemencias del tiempo, por favor llame a nuestra lnea principal al [phone] para una actualizacin sobre el estado de cualquier retraso o cierre.  Consejos para la medicacin en dermatologa: Por favor, guarde las cajas en las que vienen los medicamentos de uso tpico para ayudarle a seguir las instrucciones sobre dnde y cmo usarlos. Las farmacias generalmente imprimen las instrucciones del medicamento slo en las cajas y no directamente en los tubos del medicamento.   Si su medicamento es muy caro, por favor, pngase en contacto con nuestra oficina llamando al [phone] y presione la opcin 4 o envenos un mensaje a travs de MyChart.   No podemos decirle cul ser su copago por los medicamentos por adelantado ya que esto es diferente dependiendo de la cobertura de su seguro. Sin embargo, es posible que podamos encontrar un medicamento sustituto a menor costo o llenar un formulario para que el  seguro cubra el medicamento que se considera necesario.   Si se requiere una autorizacin previa para que su compaa de seguros cubra su medicamento, por favor permtanos de 1 a 2 das hbiles para completar este proceso.  Los precios de los medicamentos varan con frecuencia dependiendo del lugar de dnde se surte la receta y alguna farmacias pueden ofrecer precios ms baratos.  El sitio web www.goodrx.com tiene cupones para medicamentos de diferentes farmacias. Los precios aqu no tienen en cuenta lo que podra costar con la ayuda del seguro (puede ser ms barato con su seguro), pero el sitio web puede darle el precio si no utiliz ningn seguro.  - Puede imprimir el cupn correspondiente y llevarlo con su receta a la farmacia.  - Tambin puede pasar por nuestra oficina durante el horario de atencin regular y recoger una tarjeta de cupones de GoodRx.  - Si necesita que su receta se enve electrnicamente a una farmacia diferente, informe a nuestra oficina a travs de MyChart de    o por telfono llamando al [phone] y presione la opcin 4.  

## 2022-12-03 ENCOUNTER — Encounter: Payer: 59 | Admitting: Dermatology

## 2022-12-17 ENCOUNTER — Encounter: Payer: 59 | Admitting: Dermatology

## 2023-01-14 ENCOUNTER — Other Ambulatory Visit: Payer: Self-pay | Admitting: Advanced Practice Midwife

## 2023-01-14 DIAGNOSIS — Z1231 Encounter for screening mammogram for malignant neoplasm of breast: Secondary | ICD-10-CM

## 2023-02-04 ENCOUNTER — Ambulatory Visit
Admission: RE | Admit: 2023-02-04 | Discharge: 2023-02-04 | Disposition: A | Discharge status: Home / Self Care | Payer: 59 | Source: Ambulatory Visit | Attending: Advanced Practice Midwife | Admitting: Advanced Practice Midwife

## 2023-02-04 DIAGNOSIS — Z1231 Encounter for screening mammogram for malignant neoplasm of breast: Secondary | ICD-10-CM | POA: Diagnosis present

## 2023-02-06 ENCOUNTER — Other Ambulatory Visit: Payer: Self-pay | Admitting: Advanced Practice Midwife

## 2023-02-06 ENCOUNTER — Other Ambulatory Visit (HOSPITAL_COMMUNITY)
Admission: RE | Admit: 2023-02-06 | Discharge: 2023-02-06 | Disposition: A | Discharge status: Home / Self Care | Payer: 59 | Source: Ambulatory Visit | Attending: Advanced Practice Midwife | Admitting: Advanced Practice Midwife

## 2023-02-06 ENCOUNTER — Encounter: Payer: Self-pay | Admitting: Advanced Practice Midwife

## 2023-02-06 ENCOUNTER — Ambulatory Visit (INDEPENDENT_AMBULATORY_CARE_PROVIDER_SITE_OTHER): Payer: 59 | Admitting: Advanced Practice Midwife

## 2023-02-06 VITALS — BP 100/60 | Ht 63.0 in | Wt 195.0 lb

## 2023-02-06 DIAGNOSIS — Z124 Encounter for screening for malignant neoplasm of cervix: Secondary | ICD-10-CM

## 2023-02-06 DIAGNOSIS — Z1239 Encounter for other screening for malignant neoplasm of breast: Secondary | ICD-10-CM

## 2023-02-06 DIAGNOSIS — Z01419 Encounter for gynecological examination (general) (routine) without abnormal findings: Secondary | ICD-10-CM | POA: Diagnosis not present

## 2023-02-06 DIAGNOSIS — R928 Other abnormal and inconclusive findings on diagnostic imaging of breast: Secondary | ICD-10-CM

## 2023-02-06 NOTE — Progress Notes (Signed)
[facility]ne[facility] Annual Exam  PCP: Burnard Hawthorne, FNP  Chief Complaint:  Chief Complaint  Patient presents with   Annual Exam    History of Present Illness:Patient is a 57 y.o. [phone] presents for annual exam. The patient has no complaints today. She had annual mammogram yesterday- Bi Rads 0, follow up imaging for distortion.   LMP: No LMP recorded. Patient is postmenopausal.  Postcoital Bleeding: not applicable  The patient is not sexually active. She denies dyspareunia.  The patient does perform self breast exams.  There is no notable family history of breast or ovarian cancer in her family.  The patient wears seatbelts: yes.   The patient has regular exercise:  she has limited exercise due to chronic knee pain. She did join a gym and is hoping to go 2 days per week. She admits healthy diet although much of the time she only has 1 meal per day, limited H20- mostly sweet tea, and about 3-5 hours of sleep .    The patient denies current symptoms of depression.     Review of Systems: Review of Systems  Constitutional:  Negative for chills and fever.  HENT:  Negative for congestion, ear discharge, ear pain, hearing loss, sinus pain and sore throat.   Eyes:  Negative for blurred vision and double vision.  Respiratory:  Negative for cough, shortness of breath and wheezing.   Cardiovascular:  Negative for chest pain, palpitations and leg swelling.  Gastrointestinal:  Negative for abdominal pain, blood in stool, constipation, diarrhea, heartburn, melena, nausea and vomiting.  Genitourinary:  Negative for dysuria, flank pain, frequency, hematuria and urgency.  Musculoskeletal:  Positive for joint pain. Negative for back pain and myalgias.  Skin:  Negative for itching and rash.  Neurological:  Negative for dizziness, tingling, tremors, sensory change, speech change, focal weakness, seizures, loss of consciousness, weakness and headaches.  Endo/Heme/Allergies:  Negative for  environmental allergies. Does not bruise/bleed easily.  Psychiatric/Behavioral:  Negative for depression, hallucinations, memory loss, substance abuse and suicidal ideas. The patient is not nervous/anxious and does not have insomnia.     Past Medical History:  Patient Active Problem List   Diagnosis Date Noted   Numbness and tingling in left hand 03/30/2020   Hypertension    HLD (hyperlipidemia)    Anemia    Bilateral chronic knee pain 12/07/2019    Formatting of this note might be different from the original. Last Assessment & Plan:  Advised to add tylenol up to 2000 mg daily in divided doses    HSV (herpes simplex virus) infection 09/16/2018    Past Surgical History:  Past Surgical History:  Procedure Laterality Date   BREAST BIOPSY Left    needle per pt   COLONOSCOPY N/A 09/26/2015   Procedure: COLONOSCOPY;  Surgeon: Lollie Sails, MD;  Location: [facility] ENDOSCOPY;  Service: Endoscopy;  Laterality: N/A;   FINGER SURGERY     INTRAUTERINE DEVICE (IUD) INSERTION  04/2014   JOINT REPLACEMENT Right 01/2019   TKR   KNEE ARTHROTOMY Right 05/10/2020   Procedure: KNEE ARTHROTOMY AND DEBRIDEMENT;  Surgeon: Hessie Knows, MD;  Location: [facility] ORS;  Service: Orthopedics;  Laterality: Right;   KNEE CLOSED REDUCTION Right 03/01/2020   Procedure: KNEE MANIPULATION UNDER ANESTHESIA;  Surgeon: Hessie Knows, MD;  Location: [facility] ORS;  Service: Orthopedics;  Laterality: Right;   KNEE SURGERY Right    TOTAL KNEE ARTHROPLASTY Right 01/26/2020   Procedure: TOTAL KNEE ARTHROPLASTY;  Surgeon: Hessie Knows, MD;  Location: [facility] ORS;  Service: Orthopedics;  Laterality: Right;    Gynecologic History:  No LMP recorded. Patient is postmenopausal. Last Pap: 2020 Results were:  no abnormalities  Last mammogram: yesterday Results were:  Bi Rads 0 - incomplete  Obstetric History: [id number]  Family History:  Family History  Problem Relation Age of Onset   Healthy Mother    Healthy Father     Hypertension Sister    Diabetes Sister    Hypertension Sister    Diabetes Sister    Hypertension Sister    Diabetes Sister    Breast cancer Neg Hx     Social History:  Social History   Socioeconomic History   Marital status: Single    Spouse name: Not on file   Number of children: 2   Years of education: 14   Highest education level: Not on file  Occupational History   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    Living in [place].   Freight forwarder in plant, [place].   Has two sons. One with lives her.      Social Determinants of Health   Financial Resource Strain: Not on file  Food Insecurity: Not on file  Transportation Needs: Not on file  Physical Activity: Not on file  Stress: Not on file  Social Connections: Not on file  Intimate Partner Violence: Not on file    Allergies:  No Known Allergies  Medications: Prior to Admission medications   Medication Sig Start Date End Date Taking? Authorizing Provider  acetaminophen (TYLENOL) 650 MG CR tablet Take 650 mg by mouth every 8 (eight) hours as needed for pain.    Yes [provider]  ketoconazole (NIZORAL) 2 % cream SMARTSIG:1 Topical Daily 12/07/21  Yes [provider]  ketoconazole (NIZORAL) 2 % cream Apply to face qd-bid 11/08/22  Yes Brendolyn Patty, MD  losartan-hydrochlorothiazide (HYZAAR) 50-12.5 MG tablet Take 1 tablet by mouth daily. 12/20/21  Yes [provider]  metoprolol succinate (TOPROL-XL) 50 MG 24 hr tablet TAKE 1 TABLET(50 MG) BY MOUTH EVERY DAY 07/20/21  Yes  [provider]  pimecrolimus (ELIDEL) 1 % cream Apply topically as directed. Qd to bid to aa face 11/08/22  Yes Brendolyn Patty, MD  valACYclovir (VALTREX) 500 MG tablet Take 1 tablet (500 mg total) by mouth daily. 10/22/22  Yes Rod Can, CNM    Physical Exam Vitals: Blood pressure 100/60, height 5\' 3"  (1.6 m), weight 195 lb (88.5 kg).  General: NAD HEENT: normocephalic, anicteric Thyroid: no enlargement, no palpable nodules Pulmonary: No increased work of breathing, CTAB Cardiovascular: RRR, distal pulses 2+ Breast: Breast symmetrical, no tenderness, no palpable nodules or masses, no skin or nipple retraction present, no nipple discharge.  No axillary or supraclavicular lymphadenopathy. Abdomen: NABS, soft, non-tender, non-distended.  Umbilicus without lesions.  No hepatomegaly, splenomegaly or masses palpable. No evidence of hernia  Genitourinary:  External: Normal external female genitalia.  Normal urethral meatus, normal Bartholin's and Skene's glands.    Vagina: Normal vaginal mucosa, no  evidence of prolapse.    Cervix: Grossly normal in appearance, no bleeding  Uterus: Non-enlarged, mobile, normal contour.  No CMT  Adnexa: ovaries non-enlarged, no adnexal masses  Rectal: deferred  Lymphatic: no evidence of inguinal lymphadenopathy Extremities: no edema, erythema, or tenderness Neurologic: Grossly intact Psychiatric: mood appropriate, affect full    Assessment: 57 y.o. [id number] routine annual exam  Plan: Problem List Items Addressed This Visit   None Visit Diagnoses     Well woman exam with routine gynecological exam    -  Primary   Relevant Orders   Cytology - PAP   Screening for cervical cancer       Relevant Orders   Cytology - PAP   Breast screening           1) Mammogram - recommend yearly screening mammogram.  Mammogram Is up to date  2) STI screening  was offered and declined  3) ASCCP guidelines and rationale discussed.  Patient opts for  every 5 years screening interval. PAP today (4 yr interval)  4) Osteoporosis  - per USPTF routine screening DEXA at age 4  Consider FDA-approved medical therapies in postmenopausal women and men aged 69 years and older, based on the following: a) A hip or vertebral (clinical or morphometric) fracture b) T-score ? -2.5 at the femoral neck or spine after appropriate evaluation to exclude secondary causes C) Low bone mass (T-score between -1.0 and -2.5 at the femoral neck or spine) and a 10-year probability of a hip fracture ? 3% or a 10-year probability of a major osteoporosis-related fracture ? 20% based on the [place]-adapted WHO algorithm   5) Routine healthcare maintenance including cholesterol, diabetes screening discussed Declines  6) Colonoscopy: due in 2026.  Screening recommended starting at age 60 for average risk individuals, age 57 for individuals deemed at increased risk (including African Americans) and recommended to continue until age 13.  For patient age 13-85 individualized approach is recommended.  Gold standard screening is via colonoscopy, Cologuard screening is an acceptable alternative for patient unwilling or unable to undergo colonoscopy.  "Colorectal cancer screening for average?risk adults: 2018 guideline update from the American Cancer Society"CA: A Cancer Journal for Clinicians: Apr 10, 2017   7) Increase healthy lifestyle; hydration, physical activity, sleep  8) Return in about 1 year (around 02/06/2024) for annual established gyn.    Christean Leaf, CNM [facility] 02/06/23, 11:40 AM

## 2023-02-06 NOTE — Patient Instructions (Signed)
Mediterranean Diet ?A Mediterranean diet refers to food and lifestyle choices that are based on the traditions of countries located on the [place] Sea. It focuses on eating more fruits, vegetables, whole grains, beans, nuts, seeds, and heart-healthy fats, and eating less dairy, meat, eggs, and processed foods with added sugar, salt, and fat. This way of eating has been shown to help prevent certain conditions and improve outcomes for people who have chronic diseases, like kidney disease and heart disease. ?What are tips for following this plan? ?Reading food labels ?Check the serving size of packaged foods. For foods such as rice and pasta, the serving size refers to the amount of cooked product, not dry. ?Check the total fat in packaged foods. Avoid foods that have saturated fat or trans fats. ?Check the ingredient list for added sugars, such as corn syrup. ?Shopping ? ?Buy a variety of foods that offer a balanced diet, including: ?Fresh fruits and vegetables (produce). ?Grains, beans, nuts, and seeds. Some of these may be available in unpackaged forms or large amounts (in bulk). ?Fresh seafood. ?Poultry and eggs. ?Low-fat dairy products. ?Buy whole ingredients instead of prepackaged foods. ?Buy fresh fruits and vegetables in-season from local farmers markets. ?Buy plain frozen fruits and vegetables. ?If you do not have access to quality fresh seafood, buy precooked frozen shrimp or canned fish, such as tuna, salmon, or sardines. ?Stock your pantry so you always have certain foods on hand, such as olive oil, canned tuna, canned tomatoes, rice, pasta, and beans. ?Cooking ?Cook foods with extra-virgin olive oil instead of using butter or other vegetable oils. ?Have meat as a side dish, and have vegetables or grains as your main dish. This means having meat in small portions or adding small amounts of meat to foods like pasta or stew. ?Use beans or vegetables instead of meat in common dishes like chili or  lasagna. ?Experiment with different cooking methods. Try roasting, broiling, steaming, and saut?ing vegetables. ?Add frozen vegetables to soups, stews, pasta, or rice. ?Add nuts or seeds for added healthy fats and plant protein at each meal. You can add these to yogurt, salads, or vegetable dishes. ?Marinate fish or vegetables using olive oil, lemon juice, garlic, and fresh herbs. ?Meal planning ?Plan to eat one vegetarian meal one day each week. Try to work up to two vegetarian meals, if possible. ?Eat seafood two or more times a week. ?Have healthy snacks readily available, such as: ?Vegetable sticks with hummus. ?Greek yogurt. ?Fruit and nut trail mix. ?Eat balanced meals throughout the week. This includes: ?Fruit: 2-3 servings a day. ?Vegetables: 4-5 servings a day. ?Low-fat dairy: 2 servings a day. ?Fish, poultry, or lean meat: 1 serving a day. ?Beans and legumes: 2 or more servings a week. ?Nuts and seeds: 1-2 servings a day. ?Whole grains: 6-8 servings a day. ?Extra-virgin olive oil: 3-4 servings a day. ?Limit red meat and sweets to only a few servings a month. ?Lifestyle ? ?Cook and eat meals together with your family, when possible. ?Drink enough fluid to keep your urine pale yellow. ?Be physically active every day. This includes: ?Aerobic exercise like running or swimming. ?Leisure activities like gardening, walking, or housework. ?Get 7-8 hours of sleep each night. ?If recommended by your health care provider, drink red wine in moderation. This means 1 glass a day for nonpregnant women and 2 glasses a day for men. A glass of wine equals 5 oz (150 mL). ?What foods should I eat? ?Fruits ?Apples. Apricots. Avocado. Berries. Bananas. Cherries. Dates.   Figs. Grapes. Lemons. Melon. Oranges. Peaches. Plums. Pomegranate. ?Vegetables ?Artichokes. Beets. Broccoli. Cabbage. Carrots. Eggplant. Green beans. Chard. Kale. Spinach. Onions. Leeks. Peas. Squash. Tomatoes. Peppers. Radishes. ?Grains ?Whole-grain pasta. Brown  rice. Bulgur wheat. Polenta. Couscous. Whole-wheat bread. Oatmeal. Quinoa. ?Meats and other proteins ?Beans. Almonds. Sunflower seeds. Pine nuts. Peanuts. Cod. Salmon. Scallops. Shrimp. Tuna. Tilapia. Clams. Oysters. Eggs. Poultry without skin. ?Dairy ?Low-fat milk. Cheese. Greek yogurt. ?Fats and oils ?Extra-virgin olive oil. Avocado oil. Grapeseed oil. ?Beverages ?Water. Red wine. Herbal tea. ?Sweets and desserts ?Greek yogurt with honey. Baked apples. Poached pears. Trail mix. ?Seasonings and condiments ?Basil. Cilantro. Coriander. Cumin. Mint. Parsley. Sage. Rosemary. Tarragon. Garlic. Oregano. Thyme. Pepper. Balsamic vinegar. Tahini. Hummus. Tomato sauce. Olives. Mushrooms. ?The items listed above may not be a complete list of foods and beverages you can eat. Contact a dietitian for more information. ?What foods should I limit? ?This is a list of foods that should be eaten rarely or only on special occasions. ?Fruits ?Fruit canned in syrup. ?Vegetables ?Deep-fried potatoes (french fries). ?Grains ?Prepackaged pasta or rice dishes. Prepackaged cereal with added sugar. Prepackaged snacks with added sugar. ?Meats and other proteins ?Beef. Pork. Lamb. Poultry with skin. Hot dogs. Bacon. ?Dairy ?Ice cream. Sour cream. Whole milk. ?Fats and oils ?Butter. Canola oil. Vegetable oil. Beef fat (tallow). Lard. ?Beverages ?Juice. Sugar-sweetened soft drinks. Beer. Liquor and spirits. ?Sweets and desserts ?Cookies. Cakes. Pies. Candy. ?Seasonings and condiments ?Mayonnaise. Pre-made sauces and marinades. ?The items listed above may not be a complete list of foods and beverages you should limit. Contact a dietitian for more information. ?Summary ?The Mediterranean diet includes both food and lifestyle choices. ?Eat a variety of fresh fruits and vegetables, beans, nuts, seeds, and whole grains. ?Limit the amount of red meat and sweets that you eat. ?If recommended by your health care provider, drink red wine in moderation.  This means 1 glass a day for nonpregnant women and 2 glasses a day for men. A glass of wine equals 5 oz (150 mL). ?This information is not intended to replace advice given to you by your health care provider. Make sure you discuss any questions you have with your health care provider. ?Document Revised: 12/04/2019 Document Reviewed: 10/01/2019 ?Elsevier Patient Education ? 2023 Elsevier Inc. ? ?

## 2023-02-11 LAB — CYTOLOGY - PAP
Comment: NEGATIVE
Diagnosis: NEGATIVE
High risk HPV: NEGATIVE

## 2023-02-14 ENCOUNTER — Ambulatory Visit
Admission: RE | Admit: 2023-02-14 | Discharge: 2023-02-14 | Disposition: A | Discharge status: Home / Self Care | Payer: 59 | Source: Ambulatory Visit | Attending: Advanced Practice Midwife | Admitting: Advanced Practice Midwife

## 2023-02-14 DIAGNOSIS — R928 Other abnormal and inconclusive findings on diagnostic imaging of breast: Secondary | ICD-10-CM

## 2023-05-15 ENCOUNTER — Telehealth: Payer: Self-pay

## 2023-05-20 ENCOUNTER — Other Ambulatory Visit: Payer: Self-pay

## 2023-05-20 DIAGNOSIS — B009 Herpesviral infection, unspecified: Secondary | ICD-10-CM

## 2023-05-20 MED ORDER — VALACYCLOVIR HCL 500 MG PO TABS: 500.0000 mg | ORAL_TABLET | Freq: Every day | ORAL | 0 refills | Status: DC

## 2023-05-20 NOTE — Telephone Encounter (Signed)
Rx sent in

## 2023-10-07 ENCOUNTER — Other Ambulatory Visit: Payer: Self-pay | Admitting: Advanced Practice Midwife

## 2023-10-07 DIAGNOSIS — B009 Herpesviral infection, unspecified: Secondary | ICD-10-CM

## 2023-11-04 ENCOUNTER — Encounter: Payer: 59 | Admitting: Dermatology

## 2023-11-11 ENCOUNTER — Encounter: Payer: 59 | Admitting: Dermatology

## 2023-11-26 ENCOUNTER — Other Ambulatory Visit: Payer: Self-pay | Admitting: Advanced Practice Midwife

## 2023-11-26 DIAGNOSIS — B009 Herpesviral infection, unspecified: Secondary | ICD-10-CM

## 2023-11-27 MED ORDER — VALACYCLOVIR HCL 500 MG PO TABS
500.0000 mg | ORAL_TABLET | Freq: Every day | ORAL | 0 refills | Status: DC
Start: 2023-11-27 — End: 2024-03-06

## 2023-11-27 NOTE — Telephone Encounter (Signed)
 Patient called triage asking if a refill of valACYclovir  (VALTREX ) 500 MG tablet could be sent to her pharmacy, she has misplaced bottle she had (called in 09/2023). Jerryl Morin returns to clinic next Friday. Do you approve?

## 2023-11-27 NOTE — Addendum Note (Signed)
 Addended by: Melania Kirks on: 11/27/2023 01:02 PM   Modules accepted: Orders

## 2023-11-27 NOTE — Telephone Encounter (Signed)
 Pt aware Rx sent.

## 2024-01-21 ENCOUNTER — Other Ambulatory Visit: Payer: Self-pay

## 2024-01-21 ENCOUNTER — Encounter: Payer: Self-pay | Admitting: Emergency Medicine

## 2024-01-21 ENCOUNTER — Ambulatory Visit
Admission: EM | Admit: 2024-01-21 | Discharge: 2024-01-21 | Disposition: A | Discharge status: Home / Self Care | Attending: Emergency Medicine | Admitting: Emergency Medicine

## 2024-01-21 DIAGNOSIS — J101 Influenza due to other identified influenza virus with other respiratory manifestations: Secondary | ICD-10-CM | POA: Diagnosis not present

## 2024-01-21 LAB — POC COVID19/FLU A&B COMBO
Covid Antigen, POC: NEGATIVE
Influenza A Antigen, POC: POSITIVE — AB
Influenza B Antigen, POC: NEGATIVE

## 2024-01-21 MED ORDER — OSELTAMIVIR PHOSPHATE 75 MG PO CAPS: 75.0000 mg | ORAL_CAPSULE | Freq: Two times a day (BID) | ORAL | 0 refills | Status: DC

## 2024-01-21 NOTE — ED Triage Notes (Signed)
 Patient presents to [facility] for evaluation of runny nose, mucous in throat and one episode of emesis, with malaise and fatigue, and exposure to sick grandson (unknown virus).

## 2024-01-21 NOTE — Discharge Instructions (Addendum)
 Influenza A is a virus and should steadily improve in time it can take up to 7 to 10 days before you truly start to see a turnaround however things will get better  Begin Tamiflu every morning and every evening for 5 days to reduce the amount of virus in the body which helps to minimize symptoms    You can take Tylenol and/or Ibuprofen as needed for fever reduction and pain relief.   For cough: honey 1/2 to 1 teaspoon (you can dilute the honey in water or another fluid).  You can also use guaifenesin and dextromethorphan for cough. You can use a humidifier for chest congestion and cough.  If you don't have a humidifier, you can sit in the bathroom with the hot shower running.      For sore throat: try warm salt water gargles, cepacol lozenges, throat spray, warm tea or water with lemon/honey, popsicles or ice, or OTC cold relief medicine for throat discomfort.   For congestion: take a daily anti-histamine like Zyrtec, Claritin, and a oral decongestant, such as pseudoephedrine.  You can also use Flonase 1-2 sprays in each nostril daily.   It is important to stay hydrated: drink plenty of fluids (water, gatorade/powerade/pedialyte, juices, or teas) to keep your throat moisturized and help further relieve irritation/discomfort.

## 2024-01-21 NOTE — ED Provider Notes (Signed)
 Amanda Price    CSN: [id number] Arrival date & time: 01/21/24  1714      History   Chief Complaint Chief Complaint  Patient presents with   URI    HPI Amanda Price is a 58 y.o. female.   Patient presents for evaluation of intermittent headaches, nasal congestion, rhinorrhea, nonproductive cough present for 2 days.  Feels like there is mucus sitting in the throat which is caused episodes of vomiting that she tries to expel from the body.  Decreased appetite but able to tolerate food and liquids.  Has attempted to increase fluids, TheraFlu, Mucinex and Tylenol.  Denies fever, shortness of breath or wheezing.  Known sick contact.  Past Medical History:  Diagnosis Date   Anemia    Arthritis    Frequent headaches    H/O breast biopsy    Left Breast   Hypertension     Patient Active Problem List   Diagnosis Date Noted   Numbness and tingling in left hand 03/30/2020   Hypertension    HLD (hyperlipidemia)    Anemia    Bilateral chronic knee pain 12/07/2019   HSV (herpes simplex virus) infection 09/16/2018    Past Surgical History:  Procedure Laterality Date   BREAST BIOPSY Left    needle per pt   COLONOSCOPY N/A 09/26/2015   Procedure: COLONOSCOPY;  Surgeon: Christena Deem, MD;  Location: [facility] ENDOSCOPY;  Service: Endoscopy;  Laterality: N/A;   FINGER SURGERY     INTRAUTERINE DEVICE (IUD) INSERTION  04/2014   JOINT REPLACEMENT Right 01/2019   TKR   KNEE ARTHROTOMY Right 05/10/2020   Procedure: KNEE ARTHROTOMY AND DEBRIDEMENT;  Surgeon: Kennedy Bucker, MD;  Location: [facility] ORS;  Service: Orthopedics;  Laterality: Right;   KNEE CLOSED REDUCTION Right 03/01/2020   Procedure: KNEE MANIPULATION UNDER ANESTHESIA;  Surgeon: Kennedy Bucker, MD;  Location: [facility] ORS;  Service: Orthopedics;  Laterality: Right;   KNEE SURGERY Right    TOTAL KNEE ARTHROPLASTY Right 01/26/2020   Procedure: TOTAL KNEE ARTHROPLASTY;  Surgeon: Kennedy Bucker, MD;  Location: [facility] ORS;  Service:  Orthopedics;  Laterality: Right;    OB History     Gravida  4   Para  2   Term  2   Preterm      AB  2   Living  2      SAB      IAB      Ectopic      Multiple      Live Births  2            Home Medications    Prior to Admission medications   Medication Sig Start Date End Date Taking? Authorizing Provider  oseltamivir (TAMIFLU) 75 MG capsule Take 1 capsule (75 mg total) by mouth every 12 (twelve) hours. 01/21/24  Yes Jabin Tapp, Elita Boone, NP  acetaminophen (TYLENOL) 650 MG CR tablet Take 650 mg by mouth every 8 (eight) hours as needed for pain.     [provider]  ketoconazole (NIZORAL) 2 % cream SMARTSIG:1 Topical Daily 12/07/21   [provider]  ketoconazole (NIZORAL) 2 % cream Apply to face qd-bid 11/08/22   Willeen Niece, MD  losartan-hydrochlorothiazide (HYZAAR) 50-12.5 MG tablet Take 1 tablet by mouth daily. 12/20/21   [provider]  metoprolol succinate (TOPROL-XL) 50 MG 24 hr tablet TAKE 1 TABLET(50 MG) BY MOUTH EVERY DAY 07/20/21   [provider]  pimecrolimus (ELIDEL) 1 % cream Apply topically as directed. Qd  to bid to aa face 11/08/22   Willeen Niece, MD  valACYclovir (VALTREX) 500 MG tablet Take 1 tablet (500 mg total) by mouth daily. TAKE 1 TABLET(500 MG) BY MOUTH DAILY 11/27/23   Julieanne Manson, MD    Family History Family History  Problem Relation Age of Onset   Healthy Mother    Healthy Father    Hypertension Sister    Diabetes Sister    Hypertension Sister    Diabetes Sister    Hypertension Sister    Diabetes Sister    Breast cancer Neg Hx     Social History Social History   Tobacco Use   Smoking status: Never   Smokeless tobacco: Never  Vaping Use   Vaping status: Never Used  Substance Use Topics   Alcohol use: Yes    Alcohol/week: 0.0 standard drinks of alcohol    Comment: Occasionally   Drug use: No     Allergies   Patient has no known allergies.   Review of Systems Review of  Systems   Physical Exam Triage Vital Signs ED Triage Vitals  Encounter Vitals Group     BP 01/21/24 1735 115/86     Systolic BP Percentile --      Diastolic BP Percentile --      Pulse Rate 01/21/24 1735 (!) 111     Resp 01/21/24 1735 18     Temp 01/21/24 1735 100.3 F (37.9 C)     Temp Source 01/21/24 1735 Oral     SpO2 01/21/24 1735 96 %     Weight --      Height --      Head Circumference --      Peak Flow --      Pain Score 01/21/24 1736 8     Pain Loc --      Pain Education --      Exclude from Growth Chart --    No data found.  Updated Vital Signs BP 115/86 (BP Location: Left Arm)   Pulse (!) 111   Temp 100.3 F (37.9 C) (Oral)   Resp 18   SpO2 96%   Visual Acuity Right Eye Distance:   Left Eye Distance:   Bilateral Distance:    Right Eye Near:   Left Eye Near:    Bilateral Near:     Physical Exam Constitutional:      Appearance: She is ill-appearing.  HENT:     Head: Normocephalic.     Right Ear: Tympanic membrane, ear canal and external ear normal.     Left Ear: Tympanic membrane, ear canal and external ear normal.     Nose: Congestion present.     Mouth/Throat:     Mouth: Mucous membranes are moist.     Pharynx: Oropharynx is clear. No oropharyngeal exudate or posterior oropharyngeal erythema.  Eyes:     Extraocular Movements: Extraocular movements intact.  Cardiovascular:     Rate and Rhythm: Normal rate and regular rhythm.     Pulses: Normal pulses.     Heart sounds: Normal heart sounds.  Pulmonary:     Effort: Pulmonary effort is normal.     Breath sounds: Normal breath sounds.  Musculoskeletal:     Cervical back: Normal range of motion.  Neurological:     Mental Status: She is alert and oriented to person, place, and time.      UC Treatments / Results  Labs (all labs ordered are listed, but only abnormal results are displayed) Labs  Reviewed  POC COVID19/FLU A&B COMBO - Abnormal; Notable for the following components:      Result  Value   Influenza A Antigen, POC Positive (*)    All other components within normal limits    EKG   Radiology No results found.  Procedures Procedures (including critical care time)  Medications Ordered in UC Medications - No data to display  Initial Impression / Assessment and Plan / UC Course  I have reviewed the triage vital signs and the nursing notes.  Pertinent labs & imaging results that were available during my care of the patient were reviewed by me and considered in my medical decision making (see chart for details).  Influenza A  Patient is in no signs of distress nor toxic appearing.  Vital signs are stable.  Low suspicion for pneumonia, pneumothorax or bronchitis and therefore will defer imaging. Prescribed tamiflu. May use additional over-the-counter medications as needed for supportive care.  May follow-up with urgent care as needed if symptoms persist or worsen.  Note given.    Final Clinical Impressions(s) / UC Diagnoses   Final diagnoses:  Influenza A     Discharge Instructions      Influenza A is a virus and should steadily improve in time it can take up to 7 to 10 days before you truly start to see a turnaround however things will get better  Begin Tamiflu every morning and every evening for 5 days to reduce the amount of virus in the body which helps to minimize symptoms    You can take Tylenol and/or Ibuprofen as needed for fever reduction and pain relief.   For cough: honey 1/2 to 1 teaspoon (you can dilute the honey in water or another fluid).  You can also use guaifenesin and dextromethorphan for cough. You can use a humidifier for chest congestion and cough.  If you don't have a humidifier, you can sit in the bathroom with the hot shower running.      For sore throat: try warm salt water gargles, cepacol lozenges, throat spray, warm tea or water with lemon/honey, popsicles or ice, or OTC cold relief medicine for throat discomfort.   For  congestion: take a daily anti-histamine like Zyrtec, Claritin, and a oral decongestant, such as pseudoephedrine.  You can also use Flonase 1-2 sprays in each nostril daily.   It is important to stay hydrated: drink plenty of fluids (water, gatorade/powerade/pedialyte, juices, or teas) to keep your throat moisturized and help further relieve irritation/discomfort.    ED Prescriptions     Medication Sig Dispense Auth. Provider   oseltamivir (TAMIFLU) 75 MG capsule Take 1 capsule (75 mg total) by mouth every 12 (twelve) hours. 10 capsule Valinda Hoar, NP      PDMP not reviewed this encounter.   Valinda Hoar, NP 01/21/24 [phone]

## 2024-01-29 ENCOUNTER — Other Ambulatory Visit: Payer: Self-pay

## 2024-01-29 ENCOUNTER — Ambulatory Visit
Admission: EM | Admit: 2024-01-29 | Discharge: 2024-01-29 | Disposition: A | Discharge status: Home / Self Care | Attending: Emergency Medicine | Admitting: Emergency Medicine

## 2024-01-29 ENCOUNTER — Encounter: Payer: Self-pay | Admitting: Emergency Medicine

## 2024-01-29 DIAGNOSIS — J069 Acute upper respiratory infection, unspecified: Secondary | ICD-10-CM

## 2024-01-29 MED ORDER — GUAIFENESIN-CODEINE 100-10 MG/5ML PO SOLN: 5.0000 mL | Freq: Every evening | ORAL | 0 refills | Status: DC | PRN

## 2024-01-29 MED ORDER — AMOXICILLIN-POT CLAVULANATE 875-125 MG PO TABS: 1.0000 | ORAL_TABLET | Freq: Two times a day (BID) | ORAL | 0 refills | Status: DC

## 2024-01-29 NOTE — Discharge Instructions (Signed)
 Begin Augmentin every morning and every evening for 7 days to provide bacterial coverage as this is most likely causing symptoms to linger  May use cough syrup at bedtime to help you rest  You can take Tylenol and/or Ibuprofen as needed for fever reduction and pain relief.   For cough: honey 1/2 to 1 teaspoon (you can dilute the honey in water or another fluid).  You can also use guaifenesin and dextromethorphan for cough. You can use a humidifier for chest congestion and cough.  If you don't have a humidifier, you can sit in the bathroom with the hot shower running.      For sore throat: try warm salt water gargles, cepacol lozenges, throat spray, warm tea or water with lemon/honey, popsicles or ice, or OTC cold relief medicine for throat discomfort.   For congestion: take a daily anti-histamine like Zyrtec, Claritin, and a oral decongestant, such as pseudoephedrine.  You can also use Flonase 1-2 sprays in each nostril daily.   It is important to stay hydrated: drink plenty of fluids (water, gatorade/powerade/pedialyte, juices, or teas) to keep your throat moisturized and help further relieve irritation/discomfort.

## 2024-01-29 NOTE — ED Triage Notes (Signed)
 Patient presents to [facility] for evaluation of little improvement after being seen 1 week ago for flu.  Patient states she is still having significant cough and fatigue.  Patient falling asleep in chair in room during triage.

## 2024-01-29 NOTE — ED Provider Notes (Signed)
 [name]    CSN: [id number] Arrival date & time: 01/29/24  1630      History   Chief Complaint Chief Complaint  Patient presents with   URI    HPI Amanda Price is a 58 y.o. female.   Patient presents for evaluation of persisting fatigue, malaise, nasal congestion and a productive cough present for 10 days.  Diagnosed with influenza on 01/21/2024.,  Prescribe Tamiflu.  Has seen no improvement in symptoms.  Additionally has attempted Mucinex, Delsym.  Denies shortness of breath or wheezing.  Past Medical History:  Diagnosis Date   Anemia    Arthritis    Frequent headaches    H/O breast biopsy    Left Breast   Hypertension     Patient Active Problem List   Diagnosis Date Noted   Numbness and tingling in left hand 03/30/2020   Hypertension    HLD (hyperlipidemia)    Anemia    Bilateral chronic knee pain 12/07/2019   HSV (herpes simplex virus) infection 09/16/2018    Past Surgical History:  Procedure Laterality Date   BREAST BIOPSY Left    needle per pt   COLONOSCOPY N/A 09/26/2015   Procedure: COLONOSCOPY;  Surgeon: Christena Deem, MD;  Location: [facility] ENDOSCOPY;  Service: Endoscopy;  Laterality: N/A;   FINGER SURGERY     INTRAUTERINE DEVICE (IUD) INSERTION  04/2014   JOINT REPLACEMENT Right 01/2019   TKR   KNEE ARTHROTOMY Right 05/10/2020   Procedure: KNEE ARTHROTOMY AND DEBRIDEMENT;  Surgeon: Kennedy Bucker, MD;  Location: [facility] ORS;  Service: Orthopedics;  Laterality: Right;   KNEE CLOSED REDUCTION Right 03/01/2020   Procedure: KNEE MANIPULATION UNDER ANESTHESIA;  Surgeon: Kennedy Bucker, MD;  Location: [facility] ORS;  Service: Orthopedics;  Laterality: Right;   KNEE SURGERY Right    TOTAL KNEE ARTHROPLASTY Right 01/26/2020   Procedure: TOTAL KNEE ARTHROPLASTY;  Surgeon: Kennedy Bucker, MD;  Location: [facility] ORS;  Service: Orthopedics;  Laterality: Right;    OB History     Gravida  4   Para  2   Term  2   Preterm      AB  2   Living  2       SAB      IAB      Ectopic      Multiple      Live Births  2            Home Medications    Prior to Admission medications   Medication Sig Start Date End Date Taking? Authorizing Provider  amoxicillin-clavulanate (AUGMENTIN) 875-125 MG tablet Take 1 tablet by mouth every 12 (twelve) hours. 01/29/24  Yes Verdie Barrows R, NP  guaiFENesin-codeine 100-10 MG/5ML syrup Take 5 mLs by mouth at bedtime as needed for cough. 01/29/24  Yes Elsie Baynes, Elita Boone, NP  acetaminophen (TYLENOL) 650 MG CR tablet Take 650 mg by mouth every 8 (eight) hours as needed for pain.     [provider]  ketoconazole (NIZORAL) 2 % cream SMARTSIG:1 Topical Daily 12/07/21   [provider]  ketoconazole (NIZORAL) 2 % cream Apply to face qd-bid 11/08/22   Willeen Niece, MD  losartan-hydrochlorothiazide (HYZAAR) 50-12.5 MG tablet Take 1 tablet by mouth daily. 12/20/21   [provider]  metoprolol succinate (TOPROL-XL) 50 MG 24 hr tablet TAKE 1 TABLET(50 MG) BY MOUTH EVERY DAY 07/20/21   [provider]  oseltamivir (TAMIFLU) 75 MG capsule Take 1 capsule (75 mg total) by mouth every 12 (  twelve) hours. 01/21/24   Frankey Botting, Elita Boone, NP  pimecrolimus (ELIDEL) 1 % cream Apply topically as directed. Qd to bid to aa face 11/08/22   Willeen Niece, MD  valACYclovir (VALTREX) 500 MG tablet Take 1 tablet (500 mg total) by mouth daily. TAKE 1 TABLET(500 MG) BY MOUTH DAILY 11/27/23   Julieanne Manson, MD    Family History Family History  Problem Relation Age of Onset   Healthy Mother    Healthy Father    Hypertension Sister    Diabetes Sister    Hypertension Sister    Diabetes Sister    Hypertension Sister    Diabetes Sister    Breast cancer Neg Hx     Social History Social History   Tobacco Use   Smoking status: Never   Smokeless tobacco: Never  Vaping Use   Vaping status: Never Used  Substance Use Topics   Alcohol use: Yes    Alcohol/week: 0.0 standard drinks of alcohol    Comment:  Occasionally   Drug use: No     Allergies   Patient has no known allergies.   Review of Systems Review of Systems   Physical Exam Triage Vital Signs ED Triage Vitals  Encounter Vitals Group     BP 01/29/24 1700 130/88     Systolic BP Percentile --      Diastolic BP Percentile --      Pulse Rate 01/29/24 1700 (!) 111     Resp 01/29/24 1700 (!) 22     Temp 01/29/24 1700 98.3 F (36.8 C)     Temp Source 01/29/24 1700 Oral     SpO2 01/29/24 1700 92 %     Weight --      Height --      Head Circumference --      Peak Flow --      Pain Score 01/29/24 1701 0     Pain Loc --      Pain Education --      Exclude from Growth Chart --    No data found.  Updated Vital Signs BP 130/88 (BP Location: Left Arm)   Pulse (!) 111   Temp 98.3 F (36.8 C) (Oral)   Resp (!) 22   SpO2 92%   Visual Acuity Right Eye Distance:   Left Eye Distance:   Bilateral Distance:    Right Eye Near:   Left Eye Near:    Bilateral Near:     Physical Exam Constitutional:      Appearance: She is ill-appearing.  HENT:     Head: Normocephalic.     Right Ear: Tympanic membrane, ear canal and external ear normal.     Left Ear: Tympanic membrane, ear canal and external ear normal.     Nose: Congestion present.     Mouth/Throat:     Mouth: Mucous membranes are moist.     Pharynx: Oropharynx is clear. No oropharyngeal exudate or posterior oropharyngeal erythema.  Eyes:     Extraocular Movements: Extraocular movements intact.  Cardiovascular:     Rate and Rhythm: Normal rate and regular rhythm.     Pulses: Normal pulses.     Heart sounds: Normal heart sounds.  Pulmonary:     Effort: Pulmonary effort is normal.     Breath sounds: Normal breath sounds.  Musculoskeletal:     Cervical back: Normal range of motion and neck supple.  Neurological:     Mental Status: She is alert and oriented to person, place,  and time. Mental status is at baseline.      UC Treatments / Results  Labs (all  labs ordered are listed, but only abnormal results are displayed) Labs Reviewed - No data to display  EKG   Radiology No results found.  Procedures Procedures (including critical care time)  Medications Ordered in UC Medications - No data to display  Initial Impression / Assessment and Plan / UC Course  I have reviewed the triage vital signs and the nursing notes.  Pertinent labs & imaging results that were available during my care of the patient were reviewed by me and considered in my medical decision making (see chart for details).  Acute URI  Patient is in no signs of distress nor toxic appearing.  Vital signs are stable.  Low suspicion for pneumonia, pneumothorax or bronchitis and therefore will defer imaging.  Symptoms persisting to 10 days without signs of resolution will provide bacterial coverage at this time, prescribed Augmentin as well as guaifenesin codeine.  PDMP reviewed, low risk.May use additional over-the-counter medications as needed for supportive care.  May follow-up with urgent care as needed if symptoms persist or worsen.  Note given.   Final Clinical Impressions(s) / UC Diagnoses   Final diagnoses:  Acute URI     Discharge Instructions      Begin Augmentin every morning and every evening for 7 days to provide bacterial coverage as this is most likely causing symptoms to linger  May use cough syrup at bedtime to help you rest  You can take Tylenol and/or Ibuprofen as needed for fever reduction and pain relief.   For cough: honey 1/2 to 1 teaspoon (you can dilute the honey in water or another fluid).  You can also use guaifenesin and dextromethorphan for cough. You can use a humidifier for chest congestion and cough.  If you don't have a humidifier, you can sit in the bathroom with the hot shower running.      For sore throat: try warm salt water gargles, cepacol lozenges, throat spray, warm tea or water with lemon/honey, popsicles or ice, or OTC cold  relief medicine for throat discomfort.   For congestion: take a daily anti-histamine like Zyrtec, Claritin, and a oral decongestant, such as pseudoephedrine.  You can also use Flonase 1-2 sprays in each nostril daily.   It is important to stay hydrated: drink plenty of fluids (water, gatorade/powerade/pedialyte, juices, or teas) to keep your throat moisturized and help further relieve irritation/discomfort.    ED Prescriptions     Medication Sig Dispense Auth. Provider   amoxicillin-clavulanate (AUGMENTIN) 875-125 MG tablet Take 1 tablet by mouth every 12 (twelve) hours. 14 tablet Freddie Dymek R, NP   guaiFENesin-codeine 100-10 MG/5ML syrup Take 5 mLs by mouth at bedtime as needed for cough. 120 mL Jazmaine Fuelling, Elita Boone, NP      I have reviewed the PDMP during this encounter.   Valinda Hoar, NP 01/29/24 1733

## 2024-02-18 ENCOUNTER — Other Ambulatory Visit: Payer: Self-pay | Admitting: Advanced Practice Midwife

## 2024-02-18 DIAGNOSIS — Z1231 Encounter for screening mammogram for malignant neoplasm of breast: Secondary | ICD-10-CM

## 2024-03-06 ENCOUNTER — Ambulatory Visit
Admission: RE | Admit: 2024-03-06 | Discharge: 2024-03-06 | Disposition: A | Discharge status: Home / Self Care | Source: Ambulatory Visit | Attending: Advanced Practice Midwife | Admitting: Advanced Practice Midwife

## 2024-03-06 ENCOUNTER — Ambulatory Visit (INDEPENDENT_AMBULATORY_CARE_PROVIDER_SITE_OTHER): Admitting: Advanced Practice Midwife

## 2024-03-06 ENCOUNTER — Encounter: Payer: Self-pay | Admitting: Advanced Practice Midwife

## 2024-03-06 VITALS — BP 146/85 | HR 86 | Ht 63.0 in | Wt 184.0 lb

## 2024-03-06 DIAGNOSIS — Z1231 Encounter for screening mammogram for malignant neoplasm of breast: Secondary | ICD-10-CM | POA: Diagnosis present

## 2024-03-06 DIAGNOSIS — Z0000 Encounter for general adult medical examination without abnormal findings: Secondary | ICD-10-CM

## 2024-03-06 DIAGNOSIS — B009 Herpesviral infection, unspecified: Secondary | ICD-10-CM

## 2024-03-06 DIAGNOSIS — Z13220 Encounter for screening for lipoid disorders: Secondary | ICD-10-CM

## 2024-03-06 DIAGNOSIS — Z01419 Encounter for gynecological examination (general) (routine) without abnormal findings: Secondary | ICD-10-CM | POA: Diagnosis not present

## 2024-03-06 DIAGNOSIS — Z1239 Encounter for other screening for malignant neoplasm of breast: Secondary | ICD-10-CM

## 2024-03-06 DIAGNOSIS — Z131 Encounter for screening for diabetes mellitus: Secondary | ICD-10-CM

## 2024-03-06 MED ORDER — VALACYCLOVIR HCL 500 MG PO TABS
500.0000 mg | ORAL_TABLET | Freq: Every day | ORAL | 3 refills | Status: AC
Start: 2024-03-06 — End: ?

## 2024-03-06 NOTE — Patient Instructions (Signed)
 Preventive Care 16-59 Years Old, Female  Preventive care refers to lifestyle choices and visits with your health care provider that can promote health and wellness. Preventive care visits are also called wellness exams.  What can I expect for my preventive care visit?  Counseling  Your health care provider may ask you questions about your:  Medical history, including:  Past medical problems.  Family medical history.  Pregnancy history.  Current health, including:  Menstrual cycle.  Method of birth control.  Emotional well-being.  Home life and relationship well-being.  Sexual activity and sexual health.  Lifestyle, including:  Alcohol, nicotine or tobacco, and drug use.  Access to firearms.  Diet, exercise, and sleep habits.  Work and work Astronomer.  Sunscreen use.  Safety issues such as seatbelt and bike helmet use.  Physical exam  Your health care provider will check your:  Height and weight. These may be used to calculate your BMI (body mass index). BMI is a measurement that tells if you are at a healthy weight.  Waist circumference. This measures the distance around your waistline. This measurement also tells if you are at a healthy weight and may help predict your risk of certain diseases, such as type 2 diabetes and high blood pressure.  Heart rate and blood pressure.  Body temperature.  Skin for abnormal spots.  What immunizations do I need?    Vaccines are usually given at various ages, according to a schedule. Your health care provider will recommend vaccines for you based on your age, medical history, and lifestyle or other factors, such as travel or where you work.  What tests do I need?  Screening  Your health care provider may recommend screening tests for certain conditions. This may include:  Lipid and cholesterol levels.  Diabetes screening. This is done by checking your blood sugar (glucose) after you have not eaten for a while (fasting).  Pelvic exam and Pap test.  Hepatitis B test.  Hepatitis C  test.  HIV (human immunodeficiency virus) test.  STI (sexually transmitted infection) testing, if you are at risk.  Lung cancer screening.  Colorectal cancer screening.  Mammogram. Talk with your health care provider about when you should start having regular mammograms. This may depend on whether you have a family history of breast cancer.  BRCA-related cancer screening. This may be done if you have a family history of breast, ovarian, tubal, or peritoneal cancers.  Bone density scan. This is done to screen for osteoporosis.  Talk with your health care provider about your test results, treatment options, and if necessary, the need for more tests.  Follow these instructions at home:  Eating and drinking    Eat a diet that includes fresh fruits and vegetables, whole grains, lean protein, and low-fat dairy products.  Take vitamin and mineral supplements as recommended by your health care provider.  Do not drink alcohol if:  Your health care provider tells you not to drink.  You are pregnant, may be pregnant, or are planning to become pregnant.  If you drink alcohol:  Limit how much you have to 0-1 drink a day.  Know how much alcohol is in your drink. In the [place] one drink equals one 12 oz bottle of beer (355 mL), one 5 oz glass of wine (148 mL), or one 1 oz glass of hard liquor (44 mL).  Lifestyle  Brush your teeth every morning and night with fluoride toothpaste. Floss one time each day.  Exercise for at least  30 minutes 5 or more days each week.  Do not use any products that contain nicotine or tobacco. These products include cigarettes, chewing tobacco, and vaping devices, such as e-cigarettes. If you need help quitting, ask your health care provider.  Do not use drugs.  If you are sexually active, practice safe sex. Use a condom or other form of protection to prevent STIs.  If you do not wish to become pregnant, use a form of birth control. If you plan to become pregnant, see your health care provider for a  prepregnancy visit.  Take aspirin only as told by your health care provider. Make sure that you understand how much to take and what form to take. Work with your health care provider to find out whether it is safe and beneficial for you to take aspirin daily.  Find healthy ways to manage stress, such as:  Meditation, yoga, or listening to music.  Journaling.  Talking to a trusted person.  Spending time with friends and family.  Minimize exposure to UV radiation to reduce your risk of skin cancer.  Safety  Always wear your seat belt while driving or riding in a vehicle.  Do not drive:  If you have been drinking alcohol. Do not ride with someone who has been drinking.  When you are tired or distracted.  While texting.  If you have been using any mind-altering substances or drugs.  Wear a helmet and other protective equipment during sports activities.  If you have firearms in your house, make sure you follow all gun safety procedures.  Seek help if you have been physically or sexually abused.  What's next?  Visit your health care provider once a year for an annual wellness visit.  Ask your health care provider how often you should have your eyes and teeth checked.  Stay up to date on all vaccines.  This information is not intended to replace advice given to you by your health care provider. Make sure you discuss any questions you have with your health care provider.  Document Revised: 04/26/2021 Document Reviewed: 04/26/2021  Elsevier Patient Education  2024 [employer].

## 2024-03-06 NOTE — Addendum Note (Signed)
 Addended by: Albin Duckett on: 03/06/2024 09:34 AM   Modules accepted: Orders

## 2024-03-06 NOTE — Progress Notes (Signed)
 [facility]  Gynecology Annual Exam  PCP: Calista Catching, FNP  Chief Complaint:  Chief Complaint  Patient presents with   Annual Exam    History of Present Illness:Patient is a 58 y.o. [phone] presents for annual exam. The patient has no gyn complaints today. Had the flu/upper respiratory illness recently. Feeling better now and did lose some weight due to the illness. She has not yet taken her BP medication today.  LMP: No LMP recorded. Patient is postmenopausal.   The patient is not sexually active. She denies dyspareunia.  The patient does perform self breast exams.  There is no notable family history of breast or ovarian cancer in her family.  The patient wears seatbelts: yes.   The patient has regular exercise: she is active at her job and with daily activities. Has been following 12 hour fasting diet, drinking more water due to diuretic BP medication, getting enough sleep.    The patient denies current symptoms of depression.     Review of Systems: Review of Systems  Constitutional:  Negative for chills and fever.  HENT:  Negative for congestion, ear discharge, ear pain, hearing loss, sinus pain and sore throat.   Eyes:  Negative for blurred vision and double vision.  Respiratory:  Negative for cough, shortness of breath and wheezing.   Cardiovascular:  Negative for chest pain, palpitations and leg swelling.  Gastrointestinal:  Negative for abdominal pain, blood in stool, constipation, diarrhea, heartburn, melena, nausea and vomiting.  Genitourinary:  Negative for dysuria, flank pain, frequency, hematuria and urgency.  Musculoskeletal:  Negative for back pain, joint pain and myalgias.  Skin:  Negative for itching and rash.  Neurological:  Negative for dizziness, tingling, tremors, sensory change, speech change, focal weakness, seizures, loss of consciousness, weakness and headaches.  Endo/Heme/Allergies:  Negative for environmental allergies. Does not bruise/bleed  easily.  Psychiatric/Behavioral:  Negative for depression, hallucinations, memory loss, substance abuse and suicidal ideas. The patient is not nervous/anxious and does not have insomnia.     Past Medical History:  Patient Active Problem List   Diagnosis Date Noted Date Diagnosed   Numbness and tingling in left hand 03/30/2020    Hypertension     HLD (hyperlipidemia)     Anemia     Bilateral chronic knee pain 12/07/2019     Formatting of this note might be different from the original. Last Assessment & Plan:  Advised to add tylenol  up to 2000 mg daily in divided doses    HSV (herpes simplex virus) infection 09/16/2018     Past Surgical History:  Past Surgical History:  Procedure Laterality Date   BREAST BIOPSY Left    needle per pt   COLONOSCOPY N/A 09/26/2015   Procedure: COLONOSCOPY;  Surgeon: Deveron Fly, MD;  Location: [facility] ENDOSCOPY;  Service: Endoscopy;  Laterality: N/A;   FINGER SURGERY     INTRAUTERINE DEVICE (IUD) INSERTION  04/2014   JOINT REPLACEMENT Right 01/2019   TKR   KNEE ARTHROTOMY Right 05/10/2020   Procedure: KNEE ARTHROTOMY AND DEBRIDEMENT;  Surgeon: Molli Angelucci, MD;  Location: [facility] ORS;  Service: Orthopedics;  Laterality: Right;   KNEE CLOSED REDUCTION Right 03/01/2020   Procedure: KNEE MANIPULATION UNDER ANESTHESIA;  Surgeon: Molli Angelucci, MD;  Location: [facility] ORS;  Service: Orthopedics;  Laterality: Right;   KNEE SURGERY Right    TOTAL KNEE ARTHROPLASTY Right 01/26/2020   Procedure: TOTAL KNEE ARTHROPLASTY;  Surgeon: Molli Angelucci, MD;  Location: [facility] ORS;  Service: Orthopedics;  Laterality:  Right;    Gynecologic History:  No LMP recorded. Patient is postmenopausal. Last Pap: 2024 Results were:  no abnormalities  Last mammogram: 2024 Results were: BI-RAD I  Obstetric History: [id number]  Family History:  Family History  Problem Relation Age of Onset   Healthy Mother    Healthy Father    Hypertension Sister    Diabetes Sister     Hypertension Sister    Diabetes Sister    Hypertension Sister    Diabetes Sister    Breast cancer Neg Hx     Social History:  Social History   Socioeconomic History   Marital status: Single    Spouse name: Not on file   Number of children: 2   Years of education: 14   Highest education level: Not on file  Occupational History   Occupation: [employer] consultant   Occupation: Estate agent  Tobacco Use   Smoking status: Never   Smokeless tobacco: Never  Vaping Use   Vaping status: Never Used  Substance and Sexual Activity   Alcohol use: Yes    Alcohol/week: 0.0 standard drinks of alcohol    Comment: Occasionally   Drug use: No   Sexual activity: Not Currently    Birth control/protection: Post-menopausal  Other Topics Concern   Not on file  Social History Narrative   Born in [place].    Living in [place].   Estate agent in plant, [place].   Has two sons. One with lives her.      Social Drivers of Corporate investment banker Strain: Not on file  Food Insecurity: Not on file  Transportation Needs: Not on file  Physical Activity: Not on file  Stress: Not on file  Social Connections: Not on file  Intimate Partner Violence: Not on file    Allergies:  No Known Allergies  Medications: Prior to Admission medications   Medication Sig Start Date End Date Taking? Authorizing Provider  acetaminophen  (TYLENOL ) 650 MG CR tablet Take 650 mg by mouth every 8 (eight) hours as needed for pain.    Yes [provider]  ketoconazole  (NIZORAL ) 2 % cream SMARTSIG:1 Topical Daily 12/07/21  Yes [provider]  ketoconazole  (NIZORAL ) 2 % cream Apply to face qd-bid 11/08/22  Yes Artemio Larry, MD  losartan -hydrochlorothiazide  (HYZAAR) 50-12.5 MG tablet Take 1 tablet by mouth daily. 12/20/21  Yes [provider]  metoprolol  succinate (TOPROL -XL) 50 MG 24 hr tablet TAKE 1 TABLET(50 MG) BY MOUTH EVERY DAY 07/20/21  Yes [provider]  pimecrolimus   (ELIDEL ) 1 % cream Apply topically as directed. Qd to bid to aa face 11/08/22  Yes Artemio Larry, MD  valACYclovir  (VALTREX ) 500 MG tablet Take 1 tablet (500 mg total) by mouth daily. TAKE 1 TABLET(500 MG) BY MOUTH DAILY 03/06/24   Angelita Kendall, CNM    Physical Exam Vitals: Blood pressure (!) 146/85, pulse 86, height 5\' 3"  (1.6 m), weight 184 lb (83.5 kg).  General: NAD HEENT: normocephalic, anicteric Thyroid : no enlargement, no palpable nodules Pulmonary: No increased work of breathing, CTAB Cardiovascular: RRR, distal pulses 2+ Breast: Breast symmetrical, no tenderness, no palpable nodules or masses, no skin or nipple retraction present, no nipple discharge.  No axillary or supraclavicular lymphadenopathy. Abdomen: NABS, soft, non-tender, non-distended.  Umbilicus without lesions.  No hepatomegaly, splenomegaly or masses palpable. No evidence of hernia  Genitourinary:  External: Normal external female genitalia.  Normal urethral meatus, normal Bartholin's and Skene's glands.    Vagina: Normal vaginal mucosa, no evidence of  prolapse.    Cervix: Grossly normal in appearance, no bleeding  Uterus: Non-enlarged, mobile, normal contour.  No CMT  Adnexa: ovaries non-enlarged, no adnexal masses  Rectal: deferred  Lymphatic: no evidence of inguinal lymphadenopathy Extremities: no edema, erythema, or tenderness Neurologic: Grossly intact Psychiatric: mood appropriate, affect full  Female chaperone present for pelvic and breast  portions of the physical exam     Assessment: 58 y.o. [id number] routine annual exam  Plan: Problem List Items Addressed This Visit       Other   HSV (herpes simplex virus) infection   Relevant Medications   valACYclovir  (VALTREX ) 500 MG tablet   Other Visit Diagnoses       Well woman exam without gynecological exam    -  Primary       1) Mammogram - recommend yearly screening mammogram.  Mammogram  is scheduled for this morning  2) STI screening  was  not offered and therefore not obtained- she denies concerns/not sexually active  3) ASCCP guidelines and rationale discussed.  Patient opts for every 5 years screening interval  4) Osteoporosis  - per USPTF routine screening DEXA at age 69   Consider FDA-approved medical therapies in postmenopausal women and men aged 47 years and older, based on the following: a) A hip or vertebral (clinical or morphometric) fracture b) T-score <= -2.5 at the femoral neck or spine after appropriate evaluation to exclude secondary causes C) Low bone mass (T-score between -1.0 and -2.5 at the femoral neck or spine) and a 10-year probability of a hip fracture >= 3% or a 10-year probability of a major osteoporosis-related fracture >= 20% based on the [place] -adapted WHO algorithm   5) Routine healthcare maintenance including cholesterol, diabetes screening discussed Ordered today  6) Colonoscopy due next year.  Screening recommended starting at age 10 for average risk individuals, age 46 for individuals deemed at increased risk (including African Americans) and recommended to continue until age 35.  For patient age 25-85 individualized approach is recommended.  Gold standard screening is via colonoscopy, Cologuard screening is an acceptable alternative for patient unwilling or unable to undergo colonoscopy.  "Colorectal cancer screening for average?risk adults: 2018 guideline update from the American Cancer Society"CA: A Cancer Journal for Clinicians: Apr 10, 2017   7) Return in about 1 year (around 03/06/2025) for annual.    Angelita Kendall, CNM [facility] 03/06/2024 9:23 AM

## 2024-03-07 LAB — LIPID PANEL WITH LDL/HDL RATIO
Cholesterol, Total: 176 mg/dL (ref 100–199)
HDL: 39 mg/dL — ABNORMAL LOW (ref >39)
LDL Chol Calc (NIH): 118 mg/dL — ABNORMAL HIGH (ref 0–99)
LDL/HDL Ratio: 3 ratio (ref 0.0–3.2)
Triglycerides: 104 mg/dL (ref 0–149)
VLDL Cholesterol Cal: 19 mg/dL (ref 5–40)

## 2024-03-07 LAB — COMPREHENSIVE METABOLIC PANEL WITH GFR
ALT: 10 IU/L (ref 0–32)
AST: 13 IU/L (ref 0–40)
Albumin: 4 g/dL (ref 3.8–4.9)
Alkaline Phosphatase: 90 IU/L (ref 44–121)
BUN/Creatinine Ratio: 13 (ref 9–23)
BUN: 11 mg/dL (ref 6–24)
Bilirubin Total: 0.2 mg/dL (ref 0.0–1.2)
CO2: 22 mmol/L (ref 20–29)
Calcium: 9.5 mg/dL (ref 8.7–10.2)
Chloride: 100 mmol/L (ref 96–106)
Creatinine, Ser: 0.82 mg/dL (ref 0.57–1.00)
Globulin, Total: 2.8 g/dL (ref 1.5–4.5)
Glucose: 77 mg/dL (ref 70–99)
Potassium: 3.7 mmol/L (ref 3.5–5.2)
Sodium: 139 mmol/L (ref 134–144)
Total Protein: 6.8 g/dL (ref 6.0–8.5)
eGFR: 83 mL/min/{1.73_m2} (ref >59)

## 2024-03-07 LAB — CBC
Hematocrit: 34 % (ref 34.0–46.6)
Hemoglobin: 9.8 g/dL — ABNORMAL LOW (ref 11.1–15.9)
MCH: 22.8 pg — ABNORMAL LOW (ref 26.6–33.0)
MCHC: 28.8 g/dL — ABNORMAL LOW (ref 31.5–35.7)
MCV: 79 fL (ref 79–97)
Platelets: 275 10*3/uL (ref 150–450)
RBC: 4.3 x10E6/uL (ref 3.77–5.28)
RDW: 18.6 % — ABNORMAL HIGH (ref 11.7–15.4)
WBC: 7.7 10*3/uL (ref 3.4–10.8)

## 2024-03-07 LAB — HGB A1C W/O EAG: Hgb A1c MFr Bld: 5.5 % (ref 4.8–5.6)

## 2024-03-09 ENCOUNTER — Encounter: Payer: Self-pay | Admitting: Advanced Practice Midwife

## 2024-03-27 ENCOUNTER — Other Ambulatory Visit: Payer: Self-pay | Admitting: Dermatology

## 2024-03-27 DIAGNOSIS — L219 Seborrheic dermatitis, unspecified: Secondary | ICD-10-CM

## 2024-05-21 ENCOUNTER — Ambulatory Visit: Admitting: Dermatology

## 2024-05-21 DIAGNOSIS — Z79899 Other long term (current) drug therapy: Secondary | ICD-10-CM

## 2024-05-21 DIAGNOSIS — L219 Seborrheic dermatitis, unspecified: Secondary | ICD-10-CM | POA: Diagnosis not present

## 2024-05-21 MED ORDER — PIMECROLIMUS 1 % EX CREA: TOPICAL_CREAM | CUTANEOUS | 11 refills | Status: AC

## 2024-05-21 MED ORDER — KETOCONAZOLE 2 % EX CREA: TOPICAL_CREAM | CUTANEOUS | 11 refills | Status: AC

## 2024-05-21 NOTE — Patient Instructions (Addendum)
 Your prescription was sent to [facility] in [place]. A representative from [facility] will contact you within 3 business hours to verify your address and insurance information to schedule a free delivery. If for any reason you do not receive a phone call from them, please reach out to them. Their phone number is [phone] and their hours are Monday-Friday 9:00 am-5:00 pm.     Due to recent changes in healthcare laws, you may see results of your pathology and/or laboratory studies on MyChart before the doctors have had a chance to review them. We understand that in some cases there may be results that are confusing or concerning to you. Please understand that not all results are received at the same time and often the doctors may need to interpret multiple results in order to provide you with the best plan of care or course of treatment. Therefore, we ask that you please give [place] 2 business days to thoroughly review all your results before contacting the office for clarification. Should we see a critical lab result, you will be contacted sooner.   If You Need Anything After Your Visit  If you have any questions or concerns for your doctor, please call our main line at [phone] and press option 4 to reach your doctor's medical assistant. If no one answers, please leave a voicemail as directed and we will return your call as soon as possible. Messages left after 4 pm will be answered the following business day.   You may also send [place] a message via MyChart. We typically respond to MyChart messages within 1-2 business days.  For prescription refills, please ask your pharmacy to contact our office. Our fax number is [phone].  If you have an urgent issue when the clinic is closed that cannot wait until the next business day, you can page your doctor at the number below.    Please note that while we do our best to be available for urgent issues outside of office hours, we are not  available 24/7.   If you have an urgent issue and are unable to reach [place], you may choose to seek medical care at your doctor's office, retail clinic, urgent care center, or emergency room.  If you have a medical emergency, please immediately call 911 or go to the emergency department.  Pager Numbers  - Dr. Gwen Pounds: [phone]  - Dr. Roseanne Reno: [phone]  - Dr. Katrinka Blazing: [phone]   In the event of inclement weather, please call our main line at [phone] for an update on the status of any delays or closures.  Dermatology Medication Tips: Please keep the boxes that topical medications come in in order to help keep track of the instructions about where and how to use these. Pharmacies typically print the medication instructions only on the boxes and not directly on the medication tubes.   If your medication is too expensive, please contact our office at [phone] option 4 or send [place] a message through MyChart.   We are unable to tell what your co-pay for medications will be in advance as this is different depending on your insurance coverage. However, we may be able to find a substitute medication at lower cost or fill out paperwork to get insurance to cover a needed medication.   If a prior authorization is required to get your medication covered by your insurance company, please allow [place] 1-2 business days to complete this process.  Drug prices often vary depending on where the prescription is  filled and some pharmacies may offer cheaper prices.  The website www.goodrx.com contains coupons for medications through different pharmacies. The prices here do not account for what the cost may be with help from insurance (it may be cheaper with your insurance), but the website can give you the price if you did not use any insurance.  - You can print the associated coupon and take it with your prescription to the pharmacy.  - You may also stop by our office during regular business hours  and pick up a GoodRx coupon card.  - If you need your prescription sent electronically to a different pharmacy, notify our office through [facility] or by phone at [phone] option 4.     Si Usted Necesita Algo Despus de Su Visita  Tambin puede enviarnos un mensaje a travs de Clinical cytogeneticist. Por lo general respondemos a los mensajes de MyChart en el transcurso de 1 a 2 das hbiles.  Para renovar recetas, por favor pida a su farmacia que se ponga en contacto con nuestra oficina. Annie Sable de fax es [place] [phone].  Si tiene un asunto urgente cuando la clnica est cerrada y que no puede esperar hasta el siguiente da hbil, puede llamar/localizar a su doctor(a) al nmero que aparece a continuacin.   Por favor, tenga en cuenta que aunque hacemos todo lo posible para estar disponibles para asuntos urgentes fuera del horario de [place], no estamos disponibles las 24 horas del da, los 7 [address]  [address] de la [place].   Si tiene un problema urgente y no puede comunicarse con nosotros, puede optar por buscar atencin mdica  en el consultorio de su doctor(a), en una clnica privada, en un centro de atencin urgente o en una sala de emergencias.  Si tiene Engineer, drilling, por favor llame inmediatamente al 911 o vaya a la sala de emergencias.  Nmeros de bper  - Dr. Gwen Pounds: [phone]  - Dra. Roseanne Reno: [phone]  - Dr. Katrinka Blazing: [phone]   En caso de inclemencias del tiempo, por favor llame a Lacy Duverney principal al [phone] para una actualizacin sobre el [place] de cualquier retraso o cierre.  Consejos para la medicacin en dermatologa: Por favor, guarde las cajas en las que vienen los medicamentos de uso tpico para ayudarle a seguir las instrucciones sobre dnde y cmo usarlos. Las farmacias generalmente imprimen las instrucciones del medicamento slo en las cajas y no directamente en los tubos del [place].   Si su medicamento es muy caro, por favor, pngase  en contacto con Rolm Gala llamando al [phone] y presione la opcin 4 o envenos un mensaje a travs de Clinical cytogeneticist.   No podemos decirle cul ser su copago por los medicamentos por adelantado ya que esto es diferente dependiendo de la cobertura de su seguro. Sin embargo, es posible que podamos encontrar un medicamento sustituto a Audiological scientist un formulario para que el seguro cubra el medicamento que se considera necesario.   Si se requiere una autorizacin previa para que su compaa de seguros [place] su medicamento, por favor permtanos de 1 a 2 das hbiles para completar [address]  Los precios de los medicamentos varan con frecuencia dependiendo del Environmental consultant de dnde se surte la receta y alguna farmacias pueden ofrecer precios ms baratos.  El sitio web www.goodrx.com tiene cupones para medicamentos de Health and safety inspector. Los precios aqu no tienen en cuenta lo que podra costar con la ayuda del seguro (puede ser ms barato con su seguro), pero el sitio web puede  darle el precio si no utiliz [employer].  - Puede imprimir el cupn correspondiente y llevarlo con su receta a la farmacia.  - Tambin puede pasar por nuestra oficina durante el horario de atencin regular y Education officer, museum una tarjeta de cupones de GoodRx.  - Si necesita que su receta se enve electrnicamente a una farmacia diferente, informe a nuestra oficina a travs de MyChart de [place] o por telfono llamando al [phone] y presione la opcin 4.

## 2024-05-21 NOTE — Progress Notes (Signed)
   Follow-Up Visit   Subjective  Amanda Price is a 58 y.o. female who presents for the following: Seborrheic dermatitis, face, pt needs refills of Elidel  cr and Ketoconazole  cr.  Ran out of meds and currently flared at face   The following portions of the chart were reviewed this encounter and updated as appropriate: medications, allergies, medical history  Review of Systems:  No other skin or systemic complaints except as noted in HPI or Assessment and Plan.  Objective  Well appearing patient in no apparent distress; mood and affect are within normal limits.    A focused examination was performed of the following areas: face  Relevant exam findings are noted in the Assessment and Plan.    Assessment & Plan   SEBORRHEIC DERMATITIS face Exam: pink scaly patches nasolabial perioral, alar crease, brows  Chronic and persistent condition with duration or expected duration over one year. Condition is bothersome/symptomatic for patient. Currently flared.   Seborrheic Dermatitis is a chronic persistent rash characterized by pinkness and scaling most commonly of the mid face but also can occur on the scalp (dandruff), ears; mid chest, mid back and groin.  It tends to be exacerbated by stress and cooler weather.  People who have neurologic disease may experience new onset or exacerbation of existing seborrheic dermatitis.  The condition is not curable but treatable and can be controlled.  Treatment Plan: Restart Elidel  cream qd/bid aa face prn flares Restart Ketoconazole  2% cr qd/bid aa face prn flares     SEBORRHEIC DERMATITIS   Related Medications ketoconazole  (NIZORAL ) 2 % cream Apply to face qd-bid for seborrheic dermatitis pimecrolimus  (ELIDEL ) 1 % cream Apply topically as directed. Qd to bid to aa face for seborrheic dermatitis  Return in about 1 year (around 05/21/2025) for [facility] f/u.  I, Grayce Saunas, RMA, am acting as scribe for Rexene Rattler, MD .   Documentation:  I have reviewed the above documentation for accuracy and completeness, and I agree with the above.  Rexene Rattler, MD

## 2024-06-25 ENCOUNTER — Ambulatory Visit

## 2024-12-15 ENCOUNTER — Other Ambulatory Visit: Payer: Self-pay | Admitting: Orthopedic Surgery

## 2025-01-14 ENCOUNTER — Ambulatory Visit: Admit: 2025-01-14 | Admitting: Orthopedic Surgery

## 2025-05-24 ENCOUNTER — Ambulatory Visit: Admitting: Dermatology
